# Patient Record
Sex: Female | Born: 1980 | Hispanic: Yes | Marital: Married | Smoking: Never smoker
Health system: Southern US, Community
[De-identification: ages and names within clinical notes are randomized; demographics above are authoritative.]

## PROBLEM LIST (undated history)

## (undated) DIAGNOSIS — R519 Headache, unspecified: Secondary | ICD-10-CM

## (undated) HISTORY — PX: LIPOSUCTION: SHX10

## (undated) HISTORY — DX: Headache, unspecified: R51.9

---

## 2006-12-02 HISTORY — PX: TUBAL LIGATION: SHX77

## 2019-08-22 ENCOUNTER — Other Ambulatory Visit: Payer: Self-pay

## 2019-08-22 ENCOUNTER — Emergency Department
Admission: EM | Admit: 2019-08-22 | Discharge: 2019-08-22 | Disposition: A | Discharge status: Home / Self Care | Payer: Self-pay | Attending: Emergency Medicine | Admitting: Emergency Medicine

## 2019-08-22 ENCOUNTER — Encounter: Payer: Self-pay | Admitting: *Deleted

## 2019-08-22 DIAGNOSIS — B029 Zoster without complications: Secondary | ICD-10-CM | POA: Insufficient documentation

## 2019-08-22 MED ORDER — HYDROCODONE-ACETAMINOPHEN 5-325 MG PO TABS
1.0000 | ORAL_TABLET | Freq: Once | ORAL | Status: AC
  Administered 2019-08-22: 1 via ORAL
  Filled 2019-08-22: qty 1

## 2019-08-22 MED ORDER — ONDANSETRON 4 MG PO TBDP
4.0000 mg | ORAL_TABLET | Freq: Once | ORAL | Status: AC
  Administered 2019-08-22: 4 mg via ORAL
  Filled 2019-08-22: qty 1

## 2019-08-22 MED ORDER — VALACYCLOVIR HCL 1 G PO TABS: 1000.0000 mg | ORAL_TABLET | Freq: Three times a day (TID) | ORAL | 0 refills | Status: AC

## 2019-08-22 MED ORDER — HYDROCODONE-ACETAMINOPHEN 5-325 MG PO TABS: 1.0000 | ORAL_TABLET | Freq: Four times a day (QID) | ORAL | 0 refills | Status: AC | PRN

## 2019-08-22 NOTE — ED Provider Notes (Signed)
Emergency Department Provider Note  ____________________________________________  Time seen: Approximately 10:26 PM  I have reviewed the triage vital signs and the nursing notes.   HISTORY  Chief Complaint Sunburn   Historian Mother    HPI Jill Gardner is a 39 y.o. female presents to the emergency department with an erythematous, papular, vesicular rash along anterior chest wall and a 4 cm x 3 cm region follows a dermatomal distribution.  Patient recently took a trip to [place] and is concerned that she has sunburn.  She states that she applied SPF 50 and reapplied it twice.  She has no other sunburn on her upper extremities or shoulders.  She states that she typically does not burn and never develops blisters.  No other alleviating measures have been attempted.   No past medical history on file.   Immunizations up to date:  Yes.     No past medical history on file.  There are no problems to display for this patient.    Prior to Admission medications   Medication Sig Start Date End Date Taking? Authorizing Provider  HYDROcodone-acetaminophen (NORCO) 5-325 MG tablet Take 1 tablet by mouth every 6 (six) hours as needed for up to 3 days for moderate pain. 08/22/19 08/25/19  Orvil Feil, PA-C  valACYclovir (VALTREX) 1000 MG tablet Take 1 tablet (1,000 mg total) by mouth 3 (three) times daily for 7 days. 08/22/19 08/29/19  Orvil Feil, PA-C    Allergies Morphine and related and Percocet [oxycodone-acetaminophen]  No family history on file.  Social History Social History   Tobacco Use  . Smoking status: Never Smoker  . Smokeless tobacco: Never Used  Substance Use Topics  . Alcohol use: Not Currently  . Drug use: Not Currently     Review of Systems  Constitutional: No fever/chills Eyes:  No discharge ENT: No upper respiratory complaints. Respiratory: no cough. No SOB/ use of accessory muscles to breath Gastrointestinal:   No nausea, no vomiting.   No diarrhea.  No constipation. Musculoskeletal: Negative for musculoskeletal pain. Skin: Patient has rash.     ____________________________________________   PHYSICAL EXAM:  VITAL SIGNS: ED Triage Vitals  Enc Vitals Group     BP 08/22/19 2107 112/66     Pulse Rate 08/22/19 2107 95     Resp 08/22/19 2107 20     Temp 08/22/19 2107 98.7 F (37.1 C)     Temp Source 08/22/19 2107 Oral     SpO2 08/22/19 2107 99 %     Weight 08/22/19 2109 158 lb (71.7 kg)     Height 08/22/19 2109 5\' 3"  (1.6 m)     Head Circumference --      Peak Flow --      Pain Score 08/22/19 2108 10     Pain Loc --      Pain Edu? --      Excl. in GC? --      Constitutional: Alert and oriented. Well appearing and in no acute distress. Eyes: Conjunctivae are normal. PERRL. EOMI. Head: Atraumatic. Cardiovascular: Normal rate, regular rhythm. Normal S1 and S2.  Good peripheral circulation. Respiratory: Normal respiratory effort without tachypnea or retractions. Lungs CTAB. Good air entry to the bases with no decreased or absent breath sounds Gastrointestinal: Bowel sounds x 4 quadrants. Soft and nontender to palpation. No guarding or rigidity. No distention. Musculoskeletal: Full range of motion to all extremities. No obvious deformities noted Neurologic:  Normal for age. No gross focal neurologic deficits are appreciated.  Skin: Patient has an erythematous vesicular rash along chest.  Vesicles are clustered and symmetrical. Psychiatric: Mood and affect are normal for age. Speech and behavior are normal.   ____________________________________________   LABS (all labs ordered are listed, but only abnormal results are displayed)  Labs Reviewed - No data to display ____________________________________________  EKG   ____________________________________________  RADIOLOGY   No results found.  ____________________________________________    PROCEDURES  Procedure(s) performed:      Procedures     Medications  HYDROcodone-acetaminophen (NORCO/VICODIN) 5-325 MG per tablet 1 tablet (has no administration in time range)  ondansetron (ZOFRAN-ODT) disintegrating tablet 4 mg (has no administration in time range)     ____________________________________________   INITIAL IMPRESSION / ASSESSMENT AND PLAN / ED COURSE  Pertinent labs & imaging results that were available during my care of the patient were reviewed by me and considered in my medical decision making (see chart for details).      Assessment and plan Zoster 39 year old female presents to the emergency department with a vesicular, erythematous rash.  Patient states that she does not typically sunburn and did not sunburn in any other areas of her body other than anterior chest.  Rash appears like a zoster with symmetrical vesicle formation.  Patient describes a prodrome of burning prior to appearance of rash and intense pain.  She was discharged with Valtrex and Vicodin for pain.  Return precautions were given to return with new or worsening symptoms.  All patient questions were answered.   ____________________________________________  FINAL CLINICAL IMPRESSION(S) / ED DIAGNOSES  Final diagnoses:  Herpes zoster without complication      NEW MEDICATIONS STARTED DURING THIS VISIT:  ED Discharge Orders         Ordered    valACYclovir (VALTREX) 1000 MG tablet  3 times daily     08/22/19 2222    HYDROcodone-acetaminophen (NORCO) 5-325 MG tablet  Every 6 hours PRN     08/22/19 2223              This chart was dictated using voice recognition software/Dragon. Despite best efforts to proofread, errors can occur which can change the meaning. Any change was purely unintentional.     Karren Cobble 08/22/19 2231    Carrie Mew, MD 08/23/19 [phone]

## 2019-08-22 NOTE — ED Notes (Addendum)
See triage note- Pt has red raised rash present on central chest. Pt reports it is very painful, sometimes even to talk.

## 2019-08-22 NOTE — ED Triage Notes (Signed)
Pt has sunburn to chest for 4 days.  Pt has a rash and pain with chills.  Pt alert.

## 2019-08-22 NOTE — Discharge Instructions (Signed)
It could take 7 to 10 days for blisters from shingles to resolve. Please take Valtrex 3 times daily for the next 7 days. You can also take Norco for pain.

## 2020-05-03 HISTORY — PX: ABDOMINAL SURGERY: SHX537

## 2020-05-19 DIAGNOSIS — Z20822 Contact with and (suspected) exposure to covid-19: Secondary | ICD-10-CM | POA: Diagnosis not present

## 2020-05-26 ENCOUNTER — Emergency Department
Admission: EM | Admit: 2020-05-26 | Discharge: 2020-05-26 | Disposition: A | Discharge status: Home / Self Care | Payer: BC Managed Care – PPO | Attending: Emergency Medicine | Admitting: Emergency Medicine

## 2020-05-26 ENCOUNTER — Encounter: Payer: Self-pay | Admitting: Emergency Medicine

## 2020-05-26 ENCOUNTER — Emergency Department: Payer: BC Managed Care – PPO

## 2020-05-26 ENCOUNTER — Other Ambulatory Visit: Payer: Self-pay

## 2020-05-26 DIAGNOSIS — R519 Headache, unspecified: Secondary | ICD-10-CM

## 2020-05-26 DIAGNOSIS — J329 Chronic sinusitis, unspecified: Secondary | ICD-10-CM | POA: Diagnosis not present

## 2020-05-26 LAB — POC URINE PREG, ED: Preg Test, Ur: NEGATIVE

## 2020-05-26 MED ORDER — MORPHINE SULFATE (PF) 2 MG/ML IV SOLN
2.0000 mg | Freq: Once | INTRAVENOUS | Status: AC
  Administered 2020-05-26: 2 mg via INTRAMUSCULAR
  Filled 2020-05-26: qty 1

## 2020-05-26 MED ORDER — ONDANSETRON 4 MG PO TBDP
4.0000 mg | ORAL_TABLET | Freq: Once | ORAL | Status: AC
  Administered 2020-05-26: 4 mg via ORAL
  Filled 2020-05-26: qty 1

## 2020-05-26 MED ORDER — METOCLOPRAMIDE HCL 5 MG/ML IJ SOLN
10.0000 mg | Freq: Once | INTRAMUSCULAR | Status: AC
  Administered 2020-05-26: 10 mg via INTRAVENOUS
  Filled 2020-05-26: qty 2

## 2020-05-26 MED ORDER — ONDANSETRON 4 MG PO TBDP: 4.0000 mg | ORAL_TABLET | Freq: Three times a day (TID) | ORAL | 0 refills | Status: DC | PRN

## 2020-05-26 MED ORDER — AMOXICILLIN-POT CLAVULANATE 875-125 MG PO TABS: 1.0000 | ORAL_TABLET | Freq: Two times a day (BID) | ORAL | 0 refills | Status: AC

## 2020-05-26 MED ORDER — PREDNISONE 10 MG PO TABS: ORAL_TABLET | ORAL | 0 refills | Status: DC

## 2020-05-26 MED ORDER — BUTALBITAL-APAP-CAFFEINE 50-325-40 MG PO TABS: 1.0000 | ORAL_TABLET | Freq: Four times a day (QID) | ORAL | 0 refills | Status: AC | PRN

## 2020-05-26 MED ORDER — DEXAMETHASONE SODIUM PHOSPHATE 10 MG/ML IJ SOLN
10.0000 mg | Freq: Once | INTRAMUSCULAR | Status: AC
  Administered 2020-05-26: 10 mg via INTRAMUSCULAR
  Filled 2020-05-26: qty 1

## 2020-05-26 MED ORDER — DIPHENHYDRAMINE HCL 50 MG/ML IJ SOLN
25.0000 mg | Freq: Once | INTRAMUSCULAR | Status: AC
  Administered 2020-05-26: 25 mg via INTRAVENOUS
  Filled 2020-05-26: qty 1

## 2020-05-26 MED ORDER — SODIUM CHLORIDE 0.9 % IV BOLUS
1000.0000 mL | Freq: Once | INTRAVENOUS | Status: AC
  Administered 2020-05-26: 1000 mL via INTRAVENOUS

## 2020-05-26 MED ORDER — KETOROLAC TROMETHAMINE 30 MG/ML IJ SOLN
30.0000 mg | Freq: Once | INTRAMUSCULAR | Status: AC
  Administered 2020-05-26: 30 mg via INTRAVENOUS
  Filled 2020-05-26: qty 1

## 2020-05-26 NOTE — ED Provider Notes (Signed)
[facility] Emergency Department Provider Note   ____________________________________________   Event Date/Time   First MD Initiated Contact with Patient 05/26/20 1123     (approximate)  I have reviewed the triage vital signs and the nursing notes.   HISTORY  Chief Complaint Headache (X 1 week)   HPI Jill Gardner is a 40 y.o. female resents to the ED with complaint of left-sided headache for 1 week.  Patient denies any previous headaches similar to this and denies any injury.  She states that there is no history of migraines.  Patient reports nausea but no vomiting, visual changes to her left eyes is slightly blurry and a "pins-and-needles" sensation in her left hand.  Patient also reports that when she blows her nose there is blood in the mucus.  Patient has been taking over-the-counter medication without any relief of her headache.  She denies any photophobia or worsening of her headache with sound.  She denies any Covid related symptoms or exposure.  She rates her pain as a 10/10.        History reviewed. No pertinent past medical history.  There are no problems to display for this patient.   History reviewed. No pertinent surgical history.  Prior to Admission medications   Medication Sig Start Date End Date Taking? Authorizing Provider  amoxicillin-clavulanate (AUGMENTIN) 875-125 MG tablet Take 1 tablet by mouth 2 (two) times daily for 7 days. 05/26/20 06/02/20 Yes Tommi Rumps, PA-C  butalbital-acetaminophen-caffeine (FIORICET) [phone] MG tablet Take 1 tablet by mouth every 6 (six) hours as needed for headache. 05/26/20 05/26/21 Yes Levada Schilling, Jasneet Schobert L, PA-C  ondansetron (ZOFRAN ODT) 4 MG disintegrating tablet Take 1 tablet (4 mg total) by mouth every 8 (eight) hours as needed for nausea or vomiting. 05/26/20  Yes Tommi Rumps, PA-C  predniSONE (DELTASONE) 10 MG tablet Take 3 once a day until finished 05/26/20  Yes Tommi Rumps, PA-C     Allergies Morphine and related and Percocet [oxycodone-acetaminophen]  History reviewed. No pertinent family history.  Social History Social History   Tobacco Use   Smoking status: Never Smoker   Smokeless tobacco: Never Used  Substance Use Topics   Alcohol use: Not Currently   Drug use: Not Currently    Review of Systems Constitutional: No fever/chills Eyes: Vision left eye blurry. ENT: No sore throat.  Positive left-sided facial pain. Cardiovascular: Denies chest pain. Respiratory: Denies shortness of breath. Gastrointestinal: No abdominal pain.  No nausea, no vomiting.  No diarrhea. Genitourinary: Negative for dysuria. Musculoskeletal: Negative for muscle skeletal pain. Skin: Negative for rash. Neurological: Positive left-sided headache.  Negative for focal weakness or numbness.   ____________________________________________   PHYSICAL EXAM:  VITAL SIGNS: ED Triage Vitals  Enc Vitals Group     BP 05/26/20 1031 112/73     Pulse Rate 05/26/20 1031 77     Resp 05/26/20 1031 16     Temp 05/26/20 1031 98.3 F (36.8 C)     Temp Source 05/26/20 1031 Oral     SpO2 05/26/20 1031 99 %     Weight 05/26/20 1033 168 lb (76.2 kg)     Height 05/26/20 1033 5\' 3"  (1.6 m)     Head Circumference --      Peak Flow --      Pain Score 05/26/20 1033 10     Pain Loc --      Pain Edu? --      Excl. in GC? --  Constitutional: Alert and oriented. Well appearing and in no acute distress.  No photophobia noted on exam. Eyes: Conjunctivae are normal. PERRL. EOMI. Head: Atraumatic. Nose: No congestion/rhinnorhea.  Mild tenderness is noted on percussion of the left frontal and maxillary sinus. Mouth/Throat: Mucous membranes are moist.  Oropharynx non-erythematous. Neck: No stridor.  No point tenderness on palpation cervical spine posteriorly.  Range of motion without restriction. Hematological/Lymphatic/Immunilogical: No cervical lymphadenopathy. Cardiovascular: Normal  rate, regular rhythm. Grossly normal heart sounds.  Good peripheral circulation. Respiratory: Normal respiratory effort.  No retractions. Lungs CTAB. Gastrointestinal: Soft and nontender. No distention.  Musculoskeletal: Moves upper and lower extremities with any difficulty.  Normal gait was noted.  Muscle strength bilaterally is equal upper and lower extremities. Neurologic:  Normal speech and language.  Cranial nerves II through XII grossly intact.  No gross focal neurologic deficits are appreciated. No gait instability. Skin:  Skin is warm, dry and intact. No rash noted. Psychiatric: Mood and affect are normal. Speech and behavior are normal.  ____________________________________________   LABS (all labs ordered are listed, but only abnormal results are displayed)  Labs Reviewed  POC URINE PREG, ED   ____________________________________________  ____________________________________________  RADIOLOGY Beaulah Corin, personally viewed and evaluated these images (plain radiographs) as part of my medical decision making, as well as reviewing the written report by the radiologist.   Official radiology report(s): CT Head Wo Contrast  Result Date: 05/26/2020 CLINICAL DATA:  Headaches EXAM: CT HEAD WITHOUT CONTRAST TECHNIQUE: Contiguous axial images were obtained from the base of the skull through the vertex without intravenous contrast. COMPARISON:  None. FINDINGS: Brain: No evidence of acute infarction, hemorrhage, hydrocephalus, extra-axial collection or mass lesion/mass effect. Vascular: No hyperdense vessel or unexpected calcification. Skull: Normal. Negative for fracture or focal lesion. Sinuses/Orbits: There is moderate mucosal thickening involving the sphenoid sinus. Mastoid air cells are clear. Other: None IMPRESSION: 1. No acute intracranial abnormalities. 2. Sphenoid sinus inflammation. Electronically Signed   By: Signa Kell M.D.   On: 05/26/2020 12:39     ____________________________________________   PROCEDURES  Procedure(s) performed (including Critical Care):  Procedures   ____________________________________________   INITIAL IMPRESSION / ASSESSMENT AND PLAN / ED COURSE  As part of my medical decision making, I reviewed the following data within the electronic MEDICAL RECORD NUMBER Notes from prior ED visits and [place] Controlled Substance Database  40 year old female presents to the ED with complaint of left-sided headache for 1 week.  Patient denies any previous history of migraines.  She reports that she has had some nausea with pressure to the left side of her head, blurred vision, and noticing blood in the mucus when she is blowing her nose.  Patient has been taking over-the-counter medication without any relief.  She also reports some "pins-and-needles" sensation in her left hand.  Cranial nerves II through XII grossly intact and patient's grip strength bilaterally is equal.  CT scan was negative for any acute changes other than some sinus inflammation.  Patient was made aware.  With percussion to the sinuses being tender patient is being treated for sinusitis along with her headache.  Prescription for Augmentin 875, prednisone 30 mg for 5 days, Fioricet every 6 hours and Zofran if needed was sent to her pharmacy.  Patient initially states that she felt better with the Decadron, morphine 2 mg and Zofran that she was given in the ED.  When RN went to discharge patient she states that her headache was coming back.  She was given  IV fluids 1 L, Benadryl 25 mg, Toradol 30 mg IV and Reglan 10 mg IV.  After 1 hour patient states that she is improved greatly.  She is to follow-up with her PCP if any continued problems and return to the emergency department if any urgent concerns or worsening of her symptoms.  ____________________________________________   FINAL CLINICAL IMPRESSION(S) / ED DIAGNOSES  Final diagnoses:  Acute nonintractable  headache, unspecified headache type  Sinus headache     ED Discharge Orders         Ordered    ondansetron (ZOFRAN ODT) 4 MG disintegrating tablet  Every 8 hours PRN        05/26/20 1401    butalbital-acetaminophen-caffeine (FIORICET) 50-325-40 MG tablet  Every 6 hours PRN        05/26/20 1401    predniSONE (DELTASONE) 10 MG tablet        05/26/20 1401    amoxicillin-clavulanate (AUGMENTIN) 875-125 MG tablet  2 times daily        05/26/20 1401          *Please note:  Devri Kreher was evaluated in Emergency Department on 05/26/2020 for the symptoms described in the history of present illness. She was evaluated in the context of the global COVID-19 pandemic, which necessitated consideration that the patient might be at risk for infection with the SARS-CoV-2 virus that causes COVID-19. Institutional protocols and algorithms that pertain to the evaluation of patients at risk for COVID-19 are in a state of rapid change based on information released by regulatory bodies including the CDC and federal and state organizations. These policies and algorithms were followed during the patient's care in the ED.  Some ED evaluations and interventions may be delayed as a result of limited staffing during and the pandemic.*   Note:  This document was prepared using Dragon voice recognition software and may include unintentional dictation errors.    Tommi Rumps, PA-C 05/26/20 1549    Willy Eddy, MD 05/26/20 [phone]

## 2020-05-26 NOTE — ED Triage Notes (Signed)
Pt to ED via POV c/o headache x 1 week. Pt states that the pain has been constant. Pt denies hx/o migraines. Pt states that yesterday she noticed that the vision in her left eye is slightly blurry. Pt also states that yesterday she noticed a sensation of "pins and needles" in her left hand. Pt also states that when she blows her nose there is some blood in the mucus. Pt is in NAD. Speech is clear. Pt states that she has taken OTC mediation without relief.

## 2020-05-26 NOTE — ED Notes (Signed)
Pt discussed with Dr. Roxan Hockey, no protocols at this time. Pt has had tubal ligation.

## 2020-05-26 NOTE — Discharge Instructions (Addendum)
Follow-up with your primary care provider if any continued problems or concerns.  You may also follow-up with [facility] acute care if you need to be seen.  Prescriptions were sent to your pharmacy.  Take these medications only as directed.  Also the medication for headaches should not be taken while you are driving or operating machinery as it could cause drowsiness and increase your risk for injury.

## 2020-06-06 ENCOUNTER — Other Ambulatory Visit: Payer: Self-pay

## 2020-06-06 ENCOUNTER — Ambulatory Visit (INDEPENDENT_AMBULATORY_CARE_PROVIDER_SITE_OTHER): Payer: BC Managed Care – PPO | Admitting: Obstetrics and Gynecology

## 2020-06-06 ENCOUNTER — Other Ambulatory Visit (HOSPITAL_COMMUNITY)
Admission: RE | Admit: 2020-06-06 | Discharge: 2020-06-06 | Disposition: A | Discharge status: Home / Self Care | Payer: BC Managed Care – PPO | Source: Ambulatory Visit | Attending: Obstetrics and Gynecology | Admitting: Obstetrics and Gynecology

## 2020-06-06 ENCOUNTER — Encounter: Payer: Self-pay | Admitting: Obstetrics and Gynecology

## 2020-06-06 VITALS — BP 100/68 | HR 84 | Resp 16 | Wt 181.3 lb

## 2020-06-06 DIAGNOSIS — N898 Other specified noninflammatory disorders of vagina: Secondary | ICD-10-CM

## 2020-06-06 DIAGNOSIS — N920 Excessive and frequent menstruation with regular cycle: Secondary | ICD-10-CM

## 2020-06-06 DIAGNOSIS — Z01419 Encounter for gynecological examination (general) (routine) without abnormal findings: Secondary | ICD-10-CM | POA: Insufficient documentation

## 2020-06-06 LAB — POCT URINALYSIS DIPSTICK
Appearance: NORMAL
Bilirubin, UA: NEGATIVE
Blood, UA: NEGATIVE
Glucose, UA: NEGATIVE
Ketones, UA: NEGATIVE
Leukocytes, UA: NEGATIVE
Nitrite, UA: NEGATIVE
Odor: NORMAL
Protein, UA: NEGATIVE
Spec Grav, UA: 1.025 (ref 1.010–1.025)
Urobilinogen, UA: 0.2 E.U./dL
pH, UA: 6 (ref 5.0–8.0)

## 2020-06-06 NOTE — Progress Notes (Signed)
HPI:      [name] is a 40 y.o. No obstetric history on file. who LMP was Patient's last menstrual period was 05/16/2020 (exact date).  Subjective:   She presents today for her annual examination.  Patient states that she is overdue for her annual examination and Pap smear. She has a history of 4 pregnancies and 5 children with a set of mono mono twins who are now 38 years old. Patient complains of heavy menstrual bleeding each month lasting longer and having more cramps than usual.  This has worsened over the last year. She has a tubal ligation for birth control. Of significant note patient states that she has been told in the past of uterine fibroids. She also complains today of a vaginal odor.  Not sure if it is her urine. She denies new sexual partners.  Not concerned regarding STDs    Hx: The following portions of the patient's history were reviewed and updated as appropriate:             She  has a past medical history of Headache. She does not have a problem list on file. She  has a past surgical history that includes Tubal ligation (12/2006). Her family history includes Colitis in her mother; Lupus in her maternal grandmother and mother. She  reports that she has never smoked. She has never used smokeless tobacco. She reports current alcohol use. She reports previous drug use. She has a current medication list which includes the following prescription(s): amoxicillin, butalbital-acetaminophen-caffeine, ferrous sulfate, folic acid, ondansetron, phentermine, and prednisone. She is allergic to morphine and related and percocet [oxycodone-acetaminophen].       Review of Systems:  Review of Systems  Constitutional: Denied constitutional symptoms, night sweats, recent illness, fatigue, fever, insomnia and weight loss.  Eyes: Denied eye symptoms, eye pain, photophobia, vision change and visual disturbance.  Ears/Nose/Throat/Neck: Denied ear, nose, throat or neck symptoms,  hearing loss, nasal discharge, sinus congestion and sore throat.  Cardiovascular: Denied cardiovascular symptoms, arrhythmia, chest pain/pressure, edema, exercise intolerance, orthopnea and palpitations.  Respiratory: Denied pulmonary symptoms, asthma, pleuritic pain, productive sputum, cough, dyspnea and wheezing.  Gastrointestinal: Denied, gastro-esophageal reflux, melena, nausea and vomiting.  Genitourinary: See HPI for additional information.  Musculoskeletal: Denied musculoskeletal symptoms, stiffness, swelling, muscle weakness and myalgia.  Dermatologic: Denied dermatology symptoms, rash and scar.  Neurologic: Denied neurology symptoms, dizziness, headache, neck pain and syncope.  Psychiatric: Denied psychiatric symptoms, anxiety and depression.  Endocrine: Denied endocrine symptoms including hot flashes and night sweats.   Meds:   Current Outpatient Medications on File Prior to Visit  Medication Sig Dispense Refill  . amoxicillin (AMOXIL) 500 MG capsule Take 500 mg by mouth 3 (three) times daily.    . butalbital-acetaminophen-caffeine (FIORICET) 50-325-40 MG tablet Take 1 tablet by mouth every 6 (six) hours as needed for headache. 15 tablet 0  . ferrous sulfate 325 (65 FE) MG tablet Take by mouth.    . folic acid (FOLVITE) 1 MG tablet Take by mouth.    . ondansetron (ZOFRAN ODT) 4 MG disintegrating tablet Take 1 tablet (4 mg total) by mouth every 8 (eight) hours as needed for nausea or vomiting. 12 tablet 0  . phentermine (ADIPEX-P) 37.5 MG tablet phentermine 37.5 mg tablet  Take 1 tablet every day by oral route in the morning.    . predniSONE (DELTASONE) 10 MG tablet Take 3 once a day until finished 15 tablet 0   No current facility-administered medications on file prior  to visit.       The pregnancy intention screening data noted above was reviewed. Potential methods of contraception were discussed. The patient elected to proceed with Female Sterilization.     Objective:      Vitals:   06/06/20 1120  BP: 100/68  Pulse: 84  Resp: 16    Filed Weights   06/06/20 1120  Weight: 181 lb 4.8 oz (82.2 kg)              Physical examination General NAD, Conversant  HEENT Atraumatic; Op clear with mmm.  Normo-cephalic. Pupils reactive. Anicteric sclerae  Thyroid/Neck Smooth without nodularity or enlargement. Normal ROM.  Neck Supple.  Skin No rashes, lesions or ulceration. Normal palpated skin turgor. No nodularity.  Breasts: No masses or discharge.  Symmetric.  No axillary adenopathy.  Lungs: Clear to auscultation.No rales or wheezes. Normal Respiratory effort, no retractions.  Heart: NSR.  No murmurs or rubs appreciated. No periferal edema  Abdomen: Soft.  Non-tender.  No masses.  No HSM. No hernia  Extremities: Moves all appropriately.  Normal ROM for age. No lymphadenopathy.  Neuro: Oriented to PPT.  Normal mood. Normal affect.     Pelvic:   Vulva: Normal appearance.  No lesions.  Vagina: No lesions or abnormalities noted.  Support: Normal pelvic support.  Urethra No masses tenderness or scarring.  Meatus Normal size without lesions or prolapse.  Cervix: Normal appearance.  No lesions.  Anus: Normal exam.  No lesions.  Perineum: Normal exam.  No lesions.        Bimanual   Uterus:  Enlarged-12 weeks-slightly irregular.  Non-tender.  Mobile.  AV.  Adnexae: No masses.  Non-tender to palpation.  Cul-de-sac: Negative for abnormality.   U/A -no evidence of UTI.   Assessment:    No obstetric history on file. There are no problems to display for this patient.    1. Well woman exam with routine gynecological exam   2. Vaginal odor   3. Menorrhagia with regular cycle     Patient likely has uterine fibroids causing menorrhagia and cramping.   Plan:            1.  Basic Screening Recommendations The basic screening recommendations for asymptomatic women were discussed with the patient during her visit.  The age-appropriate recommendations were  discussed with her and the rational for the tests reviewed.  When I am informed by the patient that another primary care physician has previously obtained the age-appropriate tests and they are up-to-date, only outstanding tests are ordered and referrals given as necessary.  Abnormal results of tests will be discussed with her when all of her results are completed.  Routine preventative health maintenance measures emphasized: Exercise/Diet/Weight control, Tobacco Warnings, Alcohol/Substance use risks and Stress Management Pap performed -lab work ordered 2.  Nuswab for BV and yeast 3.  Ultrasound to delineate size and location of fibroids.  We have discussed the possible diagnosis of fibroids and how this affects the menstrual cycle.  Natural course and history of fibroids was discussed.  Hospital treatment options discussed.  Orders Orders Placed This Encounter  Procedures  . US PELVIS (TRANSABDOMINAL ONLY)  . US PELVIS TRANSVAGINAL NON-OB (TV ONLY)  . CBC  . Cholesterol, total  . Glucose, random  . Hemoglobin A1c  . Lipid panel  . TSH  . POCT Urinalysis Dipstick    No orders of the defined types were placed in this encounter.         F/U  Return in about 3 weeks (around 06/27/2020). I spent 14 additional minutes involved in the care of this patient discussing uterine fibroids, diagnosis and possible treatment options.  Discussing control of heavy menstrual bleeding caused by fibroids.  Differential diagnosis of vaginal odor discussed.  All questions answered.     Elonda Husky, M.D. 06/06/2020 12:46 PM

## 2020-06-09 DIAGNOSIS — Z01419 Encounter for gynecological examination (general) (routine) without abnormal findings: Secondary | ICD-10-CM | POA: Diagnosis not present

## 2020-06-09 DIAGNOSIS — N898 Other specified noninflammatory disorders of vagina: Secondary | ICD-10-CM | POA: Diagnosis not present

## 2020-06-09 LAB — CERVICOVAGINAL ANCILLARY ONLY
Bacterial Vaginitis (gardnerella): NEGATIVE
Candida Glabrata: NEGATIVE
Candida Vaginitis: NEGATIVE
Chlamydia: NEGATIVE
Comment: NEGATIVE
Comment: NEGATIVE
Comment: NEGATIVE
Comment: NEGATIVE
Comment: NORMAL
Neisseria Gonorrhea: NEGATIVE

## 2020-06-10 ENCOUNTER — Telehealth: Payer: Self-pay

## 2020-06-10 NOTE — Telephone Encounter (Signed)
Patient informed all testing was within normal range per Dr Logan Bores.

## 2020-06-13 LAB — CYTOLOGY - PAP
Comment: NEGATIVE
Diagnosis: NEGATIVE
Diagnosis: REACTIVE
High risk HPV: NEGATIVE

## 2020-06-17 ENCOUNTER — Ambulatory Visit (INDEPENDENT_AMBULATORY_CARE_PROVIDER_SITE_OTHER): Payer: BC Managed Care – PPO

## 2020-06-17 ENCOUNTER — Other Ambulatory Visit: Payer: Self-pay

## 2020-06-17 ENCOUNTER — Other Ambulatory Visit: Payer: BC Managed Care – PPO

## 2020-06-17 DIAGNOSIS — Z01419 Encounter for gynecological examination (general) (routine) without abnormal findings: Secondary | ICD-10-CM | POA: Diagnosis not present

## 2020-06-17 DIAGNOSIS — N920 Excessive and frequent menstruation with regular cycle: Secondary | ICD-10-CM

## 2020-06-18 LAB — LIPID PANEL
Chol/HDL Ratio: 5.4 ratio — ABNORMAL HIGH (ref 0.0–4.4)
Cholesterol, Total: 256 mg/dL — ABNORMAL HIGH (ref 100–199)
HDL: 47 mg/dL (ref >39)
LDL Chol Calc (NIH): 173 mg/dL — ABNORMAL HIGH (ref 0–99)
Triglycerides: 194 mg/dL — ABNORMAL HIGH (ref 0–149)
VLDL Cholesterol Cal: 36 mg/dL (ref 5–40)

## 2020-06-18 LAB — CBC
Hematocrit: 35.4 % (ref 34.0–46.6)
Hemoglobin: 11.7 g/dL (ref 11.1–15.9)
MCH: 27.4 pg (ref 26.6–33.0)
MCHC: 33.1 g/dL (ref 31.5–35.7)
MCV: 83 fL (ref 79–97)
Platelets: 300 10*3/uL (ref 150–450)
RBC: 4.27 x10E6/uL (ref 3.77–5.28)
RDW: 13.8 % (ref 11.7–15.4)
WBC: 5.3 10*3/uL (ref 3.4–10.8)

## 2020-06-18 LAB — TSH: TSH: 2.61 u[IU]/mL (ref 0.450–4.500)

## 2020-06-18 LAB — GLUCOSE, RANDOM: Glucose: 95 mg/dL (ref 65–99)

## 2020-07-28 ENCOUNTER — Emergency Department (HOSPITAL_COMMUNITY)
Admission: EM | Admit: 2020-07-28 | Discharge: 2020-07-28 | Disposition: A | Discharge status: Home / Self Care | Payer: BC Managed Care – PPO | Attending: Emergency Medicine | Admitting: Emergency Medicine

## 2020-07-28 ENCOUNTER — Encounter (HOSPITAL_COMMUNITY): Payer: Self-pay | Admitting: Emergency Medicine

## 2020-07-28 ENCOUNTER — Other Ambulatory Visit: Payer: Self-pay

## 2020-07-28 DIAGNOSIS — M546 Pain in thoracic spine: Secondary | ICD-10-CM | POA: Diagnosis not present

## 2020-07-28 MED ORDER — CYCLOBENZAPRINE HCL 5 MG PO TABS
7.5000 mg | ORAL_TABLET | Freq: Once | ORAL | Status: AC
  Administered 2020-07-28: 7.5 mg via ORAL
  Filled 2020-07-28: qty 1.5

## 2020-07-28 MED ORDER — KETOROLAC TROMETHAMINE 30 MG/ML IJ SOLN
30.0000 mg | Freq: Once | INTRAMUSCULAR | Status: AC
  Administered 2020-07-28: 30 mg via INTRAMUSCULAR

## 2020-07-28 MED ORDER — CYCLOBENZAPRINE HCL 10 MG PO TABS: 10.0000 mg | ORAL_TABLET | Freq: Two times a day (BID) | ORAL | 0 refills | Status: DC | PRN

## 2020-07-28 MED ORDER — KETOROLAC TROMETHAMINE 30 MG/ML IJ SOLN
30.0000 mg | Freq: Once | INTRAMUSCULAR | Status: DC
  Filled 2020-07-28: qty 1

## 2020-07-28 NOTE — ED Triage Notes (Signed)
Per EMS- Patient was sitting in a chair at work and began having right lower back pain approx 1 hour ago. Patient rates pain 6/10. patient denies any injuries, trauma, or falls. patient denies any issues with urinating.

## 2020-07-28 NOTE — ED Provider Notes (Signed)
[facility]-EMERGENCY DEPT Provider Note   CSN: [id number] Arrival date & time: 07/28/20  1527     History Chief Complaint  Patient presents with  . Back Pain    Jill Gardner is a 40 y.o. female.  HPI   Patient with no significant medical history presents with chief complaint of right-sided upper back pain.  She endorses this started 1 hour ago.  She was sitting at her desk went to stand up and felt like her back locked up.  She has worsening pain when she ambulates specially when she moves her right leg when she moves her right arm. She denies paresthesias or weakness in the upper or lower extremities, denies urinary incontinency, retention, difficult bowel movements.  She has no autoimmune diseases, no history of IV drug use, no history of kidney stones or connective tissue disorders.  She denies  urinary symptoms, denies cough, congestion, chest pain or shortness of breath, has no history of PEs or DVTs, currently not on hormone therapy.  Patient has not taken any medication for this, never experienced this in the past denies relieving factors.  Patient has headaches, fevers, chills, shortness of breath, chest pain, abdominal pain, nausea, vomiting, diarrhea, worsening pedal edema.  Past Medical History:  Diagnosis Date  . Headache     There are no problems to display for this patient.   Past Surgical History:  Procedure Laterality Date  . TUBAL LIGATION  12/2006     OB History   No obstetric history on file.     Family History  Problem Relation Age of Onset  . Lupus Mother   . Colitis Mother   . Lupus Maternal Grandmother     Social History   Tobacco Use  . Smoking status: Never Smoker  . Smokeless tobacco: Never Used  Substance Use Topics  . Alcohol use: Yes  . Drug use: Not Currently    Home Medications Prior to Admission medications   Medication Sig Start Date End Date Taking? Authorizing Provider  cyclobenzaprine (FLEXERIL) 10  MG tablet Take 1 tablet (10 mg total) by mouth 2 (two) times daily as needed for muscle spasms. 07/28/20  Yes Carroll Sage, PA-C  amoxicillin (AMOXIL) 500 MG capsule Take 500 mg by mouth 3 (three) times daily. 04/08/20   [provider]  butalbital-acetaminophen-caffeine (FIORICET) 50-325-40 MG tablet Take 1 tablet by mouth every 6 (six) hours as needed for headache. 05/26/20 05/26/21  Bridget Hartshorn L, PA-C  ferrous sulfate 325 (65 FE) MG tablet Take by mouth.    [provider]  folic acid (FOLVITE) 1 MG tablet Take by mouth. 05/10/18   [provider]  ondansetron (ZOFRAN ODT) 4 MG disintegrating tablet Take 1 tablet (4 mg total) by mouth every 8 (eight) hours as needed for nausea or vomiting. 05/26/20   Tommi Rumps, PA-C  phentermine (ADIPEX-P) 37.5 MG tablet phentermine 37.5 mg tablet  Take 1 tablet every day by oral route in the morning.    [provider]  predniSONE (DELTASONE) 10 MG tablet Take 3 once a day until finished 05/26/20   Tommi Rumps, PA-C    Allergies    Morphine and related and Percocet [oxycodone-acetaminophen]  Review of Systems   Review of Systems  Constitutional: Negative for chills and fever.  HENT: Negative for congestion.   Respiratory: Negative for shortness of breath.   Cardiovascular: Negative for chest pain.  Gastrointestinal: Negative for abdominal pain, diarrhea, nausea and vomiting.  Genitourinary:  Negative for enuresis and flank pain.  Musculoskeletal: Positive for back pain.  Skin: Negative for rash.  Neurological: Negative for dizziness and headaches.  Hematological: Does not bruise/bleed easily.    Physical Exam Updated Vital Signs BP 118/87 (BP Location: Left Arm)   Pulse 72   Temp (!) 97.5 F (36.4 C) (Oral)   Resp 18   Ht 5\' 3"  (1.6 m)   Wt 79.4 kg   SpO2 98%   BMI 31.00 kg/m   Physical Exam Vitals and nursing note reviewed.  Constitutional:      General: She is not in acute  distress.    Appearance: She is not ill-appearing.  HENT:     Head: Normocephalic and atraumatic.     Nose: No congestion.  Eyes:     Conjunctiva/sclera: Conjunctivae normal.  Cardiovascular:     Rate and Rhythm: Normal rate and regular rhythm.     Pulses: Normal pulses.     Heart sounds: No murmur heard. No friction rub. No gallop.   Pulmonary:     Effort: No respiratory distress.     Breath sounds: No wheezing, rhonchi or rales.  Abdominal:     Palpations: Abdomen is soft.     Tenderness: There is no abdominal tenderness. There is no right CVA tenderness or left CVA tenderness.  Musculoskeletal:     Cervical back: No rigidity.     Comments: Spine was palpated it was nontender to palpation, no step-off or deformities present.  Patient noted tenderness along the right inferior aspect of the scapula, worsened with right arm movement.  Patient can move all 4 extremities without difficulties, 5 of 5 strength.   Skin:    General: Skin is warm and dry.  Neurological:     Mental Status: She is alert.  Psychiatric:        Mood and Affect: Mood normal.     ED Results / Procedures / Treatments   Labs (all labs ordered are listed, but only abnormal results are displayed) Labs Reviewed - No data to display  EKG None  Radiology No results found.  Procedures Procedures   Medications Ordered in ED Medications  ketorolac (TORADOL) 30 MG/ML injection 30 mg (has no administration in time range)  cyclobenzaprine (FLEXERIL) tablet 7.5 mg (has no administration in time range)    ED Course  I have reviewed the triage vital signs and the nursing notes.  Pertinent labs & imaging results that were available during my care of the patient were reviewed by me and considered in my medical decision making (see chart for details).    MDM Rules/Calculators/A&P                         Initial impression-acute right-sided back pain.  She is alert, does not appear acute distress, vital  signs reassuring.  Work-up-due to well-appearing patient, benign physical exam, further lab work and imaging not warranted at this time.  Rule out- I have low suspicion for spinal fracture or spinal cord abnormality as patient denies urinary incontinency, retention, difficulty with bowel movements, denies saddle paresthesias.  Spine was palpated there is no step-off, crepitus or gross deformities felt, patient had full range of motion in all 4 extremities.  Due to no recent traumas will defer imaging at this time .low suspicion for AAA as patient has low risk factors, exam consistent with etiology, no bulging mass on my exam.  Low suspicion for septic arthritis as patient denies  IV drug use, skin exam was performed no erythematous, edema or warm joints noted.  low suspicion for UTI or pyelonephritis as patient has no CVA tenderness, denies urinary symptoms.  Low suspicion for kidney stone as patient has no history of this, denies urinary symptoms, no CVA tenderness.  Low suspicion for lower lobe pneumonia as patient denies URI-like symptoms, lung sounds are clear bilaterally.   Plan-suspect patient is suffering from a muscular strain, will provide patient with muscle relaxers, recommend over-the-counter pain medications, stretching follow-up PCP for further evaluation.  Vital signs have remained stable, no indication for hospital admission.   Patient given at home care as well strict return precautions.  Patient verbalized that they understood agreed to said plan.   Final Clinical Impression(s) / ED Diagnoses Final diagnoses:  Acute right-sided thoracic back pain    Rx / DC Orders ED Discharge Orders         Ordered    cyclobenzaprine (FLEXERIL) 10 MG tablet  2 times daily PRN        07/28/20 1606           Barnie Del 07/28/20 1630    Lorre Nick, MD 07/29/20 [phone]

## 2020-07-28 NOTE — Discharge Instructions (Signed)
You have been seen here for back pain.  Given you prescription for muscle relaxer please take as prescribed, please beware this medication make you drowsy do not consume alcohol or operate heavy machinery while taking this medication.  I recommend taking over-the-counter pain medications like ibuprofen and/or Tylenol every 6 as needed.  Please follow dosage and on the back of bottle.  I also recommend applying heat to the area and stretching out the muscles as this will help decrease stiffness and pain.  I have given you information on exercises please follow.  I want you to follow-up with primary care provider in 1 week's time for reevaluation.  Given you the contact information above please call  Come back to the emergency department if you develop chest pain, shortness of breath, severe abdominal pain, uncontrolled nausea, vomiting, diarrhea.

## 2020-10-02 ENCOUNTER — Encounter: Payer: BC Managed Care – PPO | Admitting: Obstetrics and Gynecology

## 2020-10-07 ENCOUNTER — Encounter: Payer: BC Managed Care – PPO | Admitting: Obstetrics and Gynecology

## 2020-10-17 ENCOUNTER — Encounter: Payer: Self-pay | Admitting: Obstetrics and Gynecology

## 2020-10-22 ENCOUNTER — Encounter: Payer: Self-pay | Admitting: Obstetrics and Gynecology

## 2021-06-19 ENCOUNTER — Other Ambulatory Visit: Payer: Self-pay

## 2021-06-19 ENCOUNTER — Encounter: Payer: Self-pay | Admitting: Obstetrics and Gynecology

## 2021-06-19 ENCOUNTER — Ambulatory Visit (INDEPENDENT_AMBULATORY_CARE_PROVIDER_SITE_OTHER): Payer: Self-pay | Admitting: Obstetrics and Gynecology

## 2021-06-19 VITALS — BP 88/64 | HR 80 | Ht 63.0 in | Wt 149.5 lb

## 2021-06-19 DIAGNOSIS — R399 Unspecified symptoms and signs involving the genitourinary system: Secondary | ICD-10-CM

## 2021-06-19 LAB — POCT URINALYSIS DIPSTICK
Glucose, UA: NEGATIVE
Ketones, UA: NEGATIVE
Leukocytes, UA: NEGATIVE
Nitrite, UA: NEGATIVE
Protein, UA: NEGATIVE
Spec Grav, UA: 1.025 (ref 1.010–1.025)
Urobilinogen, UA: 0.2 E.U./dL
pH, UA: 6 (ref 5.0–8.0)

## 2021-06-19 NOTE — Progress Notes (Signed)
HPI:      [name] is a 41 y.o. [id number] who LMP was Patient's last menstrual period was 05/22/2021 (exact date).  Subjective:   She presents today stating that she has symptoms of a urinary tract infection with urinary frequency and her urine is dark in color.  She would like to have her urine checked. She denies any vaginal bleeding at this time. She states that for approximately 2 weeks she has had a small rash on her neck and on her left side that is itchy.  She denies changing any detergents soaps etc.  She knows of nothing that she could have contacted to cause this.    Hx: The following portions of the patient's history were reviewed and updated as appropriate:             She  has a past medical history of Headache. She does not have a problem list on file. She  has a past surgical history that includes Tubal ligation (12/2006) and Abdominal surgery (2022). Her family history includes Colitis in her mother; Lupus in her maternal grandmother and mother. She  reports that she has never smoked. She has never used smokeless tobacco. She reports current alcohol use. She reports that she does not currently use drugs. She has a current medication list which includes the following prescription(s): prednisone, amoxicillin, cyclobenzaprine, ferrous sulfate, folic acid, and phentermine. She is allergic to morphine and related and percocet [oxycodone-acetaminophen].       Review of Systems:  Review of Systems  Constitutional: Denied constitutional symptoms, night sweats, recent illness, fatigue, fever, insomnia and weight loss.  Eyes: Denied eye symptoms, eye pain, photophobia, vision change and visual disturbance.  Ears/Nose/Throat/Neck: Denied ear, nose, throat or neck symptoms, hearing loss, nasal discharge, sinus congestion and sore throat.  Cardiovascular: Denied cardiovascular symptoms, arrhythmia, chest pain/pressure, edema, exercise intolerance, orthopnea and palpitations.   Respiratory: Denied pulmonary symptoms, asthma, pleuritic pain, productive sputum, cough, dyspnea and wheezing.  Gastrointestinal: Denied, gastro-esophageal reflux, melena, nausea and vomiting.  Genitourinary: See HPI for additional information.  Musculoskeletal: Denied musculoskeletal symptoms, stiffness, swelling, muscle weakness and myalgia.  Dermatologic: See HPI for additional information.  Neurologic: Denied neurology symptoms, dizziness, headache, neck pain and syncope.  Psychiatric: Denied psychiatric symptoms, anxiety and depression.  Endocrine: Denied endocrine symptoms including hot flashes and night sweats.   Meds:   Current Outpatient Medications on File Prior to Visit  Medication Sig Dispense Refill   predniSONE (DELTASONE) 10 MG tablet Take 3 once a day until finished 15 tablet 0   amoxicillin (AMOXIL) 500 MG capsule Take 500 mg by mouth 3 (three) times daily. (Patient not taking: Reported on 06/19/2021)     cyclobenzaprine (FLEXERIL) 10 MG tablet Take 1 tablet (10 mg total) by mouth 2 (two) times daily as needed for muscle spasms. (Patient not taking: Reported on 06/19/2021) 20 tablet 0   ferrous sulfate 325 (65 FE) MG tablet Take by mouth. (Patient not taking: Reported on 0000000)     folic acid (FOLVITE) 1 MG tablet Take by mouth. (Patient not taking: Reported on 06/19/2021)     phentermine (ADIPEX-P) 37.5 MG tablet phentermine 37.5 mg tablet  Take 1 tablet every day by oral route in the morning. (Patient not taking: Reported on 06/19/2021)     No current facility-administered medications on file prior to visit.      Objective:     Vitals:   06/19/21 1101  BP: (!) 88/64  Pulse: 80   Filed  Weights   06/19/21 1101  Weight: 149 lb 8 oz (67.8 kg)              UA shows moderate blood but nothing else significant.  Patient with a mildly erythematous punctate rash on her neck dime sized and an area on her left flank approximately half dollar size.           Assessment:    [id number] There are no problems to display for this patient.    1. UTI symptoms     No obvious UTI based on dip but moderate blood.  Will send for culture.   Plan:            1.  Urine for culture  2.  1% hydrocortisone cream to rash.  3.  Normal ultrasound findings discussed.  4.  Elevated cholesterol discussed-patient will discuss this further with her primary care doctor. Orders Orders Placed This Encounter  Procedures   Urine Culture   POCT urinalysis dipstick    No orders of the defined types were placed in this encounter.     F/U  No follow-ups on file. I spent 23 minutes involved in the care of this patient preparing to see the patient by obtaining and reviewing her medical history (including labs, imaging tests and prior procedures), documenting clinical information in the electronic health record (EHR), counseling and coordinating care plans, writing and sending prescriptions, ordering tests or procedures and in direct communicating with the patient and medical staff discussing pertinent items from her history and physical exam.  Finis Bud, M.D. 06/19/2021 11:26 AM

## 2021-06-19 NOTE — Progress Notes (Deleted)
Patient presents for possible UTI. Patient states having discharge, substances in urine, dark colored urine and the feeling of not emptying her bladder fully. Patient states this has been going on for ____ Patient has tried____  Patient states no other questions or concerns.

## 2021-06-19 NOTE — Progress Notes (Signed)
Patient presents today for possible UTI. Patient states discharge, urine dark in color and the feeling of being unable to fully empty bladder going on for 2-3 weeks. Patient states a recent rash on neck and L side of abdominal.

## 2021-06-19 NOTE — Progress Notes (Signed)
c 

## 2021-06-22 LAB — URINE CULTURE

## 2022-02-18 IMAGING — CT CT HEAD W/O CM
3 series · 16 of 47 positions shown, 19 images · non-contrast
Comparison: None.

CLINICAL DATA: Headaches

EXAM:
CT HEAD WITHOUT CONTRAST
TECHNIQUE: Contiguous axial images were obtained from the base of the skull
through the vertex without intravenous contrast.

[Series 3: head wo · axial · 0.40mm/px · z∈[+1007,+1137]mm · 10 of 32 slices shown, 13 images]
[im 3/32  brain]
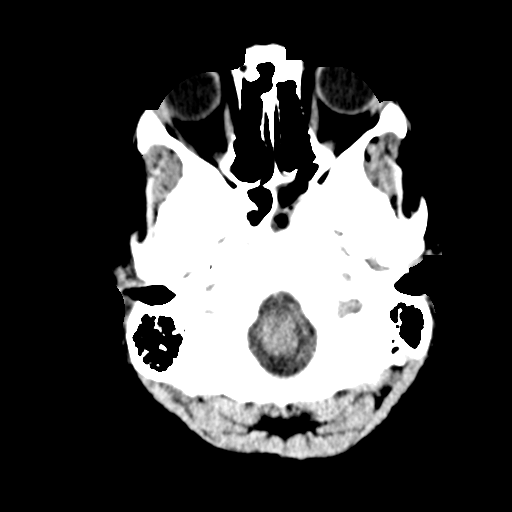
[im 3/32  bone]
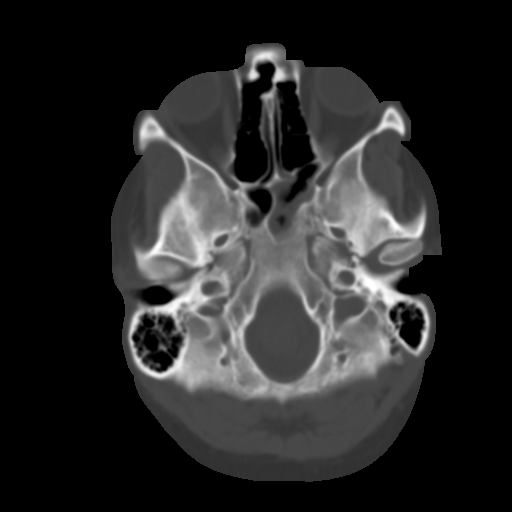
[im 6/32  brain]
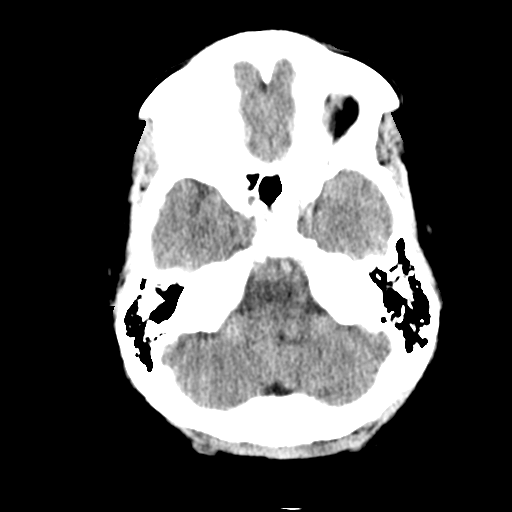
[im 9/32  brain]
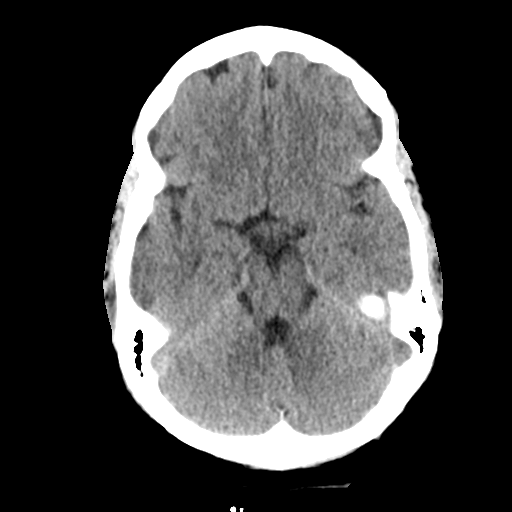
[im 11/32  brain]
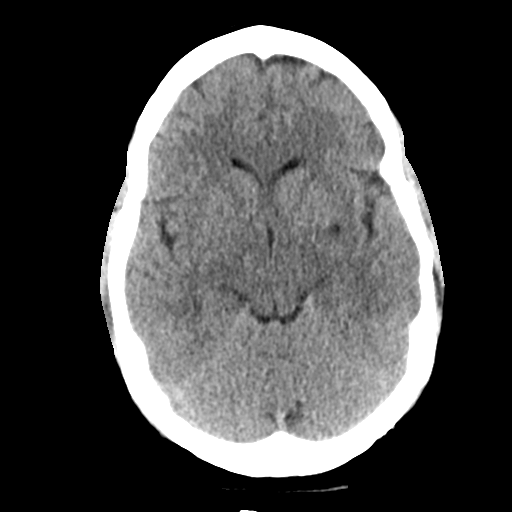
[im 14/32  brain]
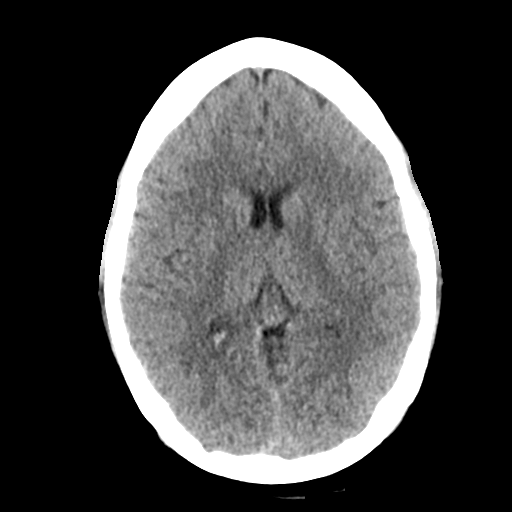
[im 14/32  bone]
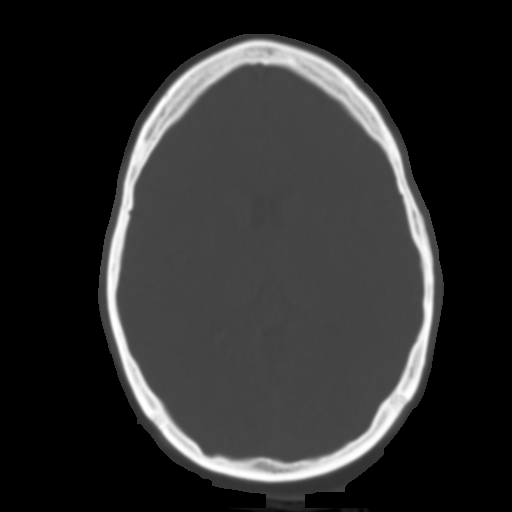
[im 18/32  brain]
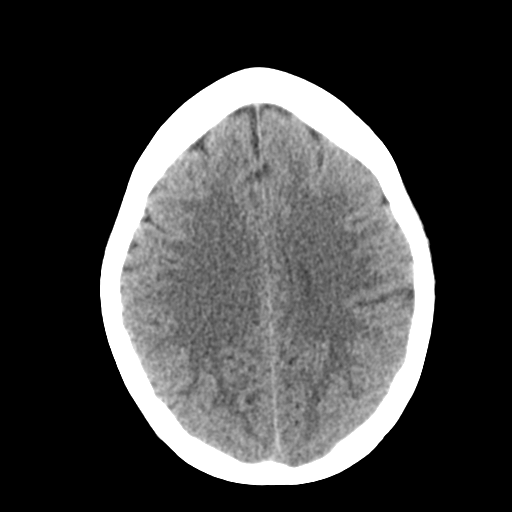
[im 21/32  brain]
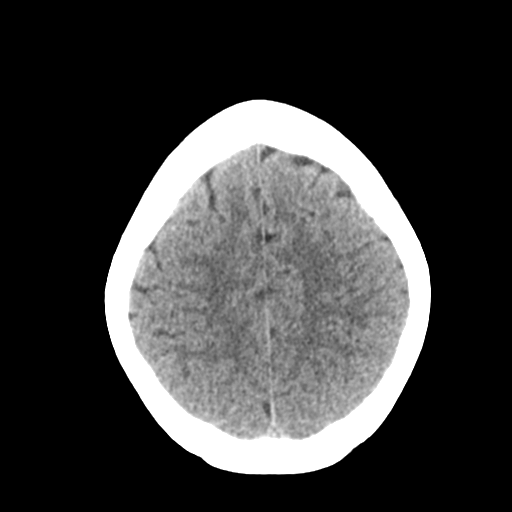
[im 24/32  brain]
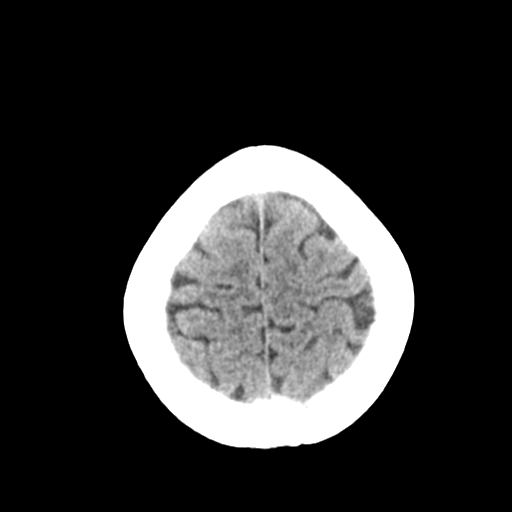
[im 26/32  brain]
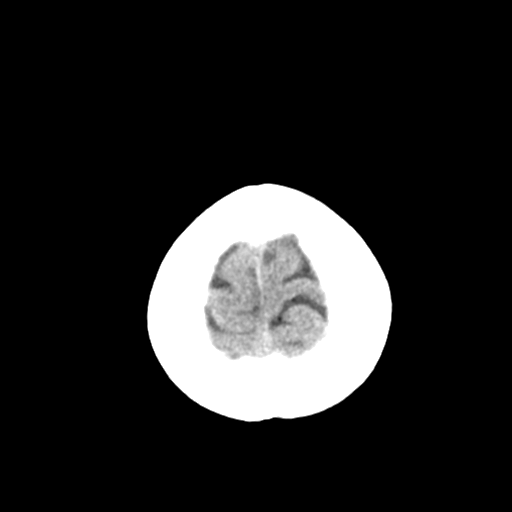
[im 26/32  bone]
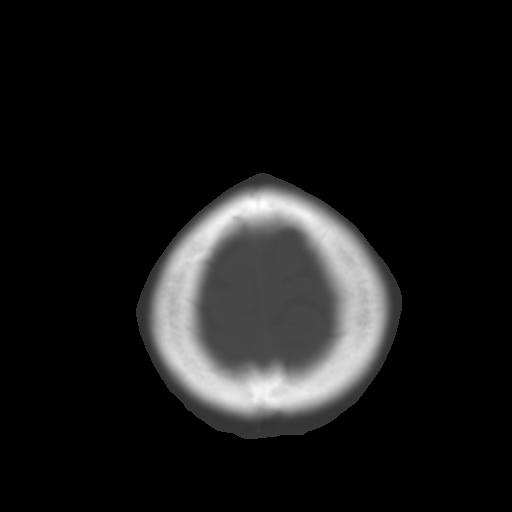
[im 29/32  brain]
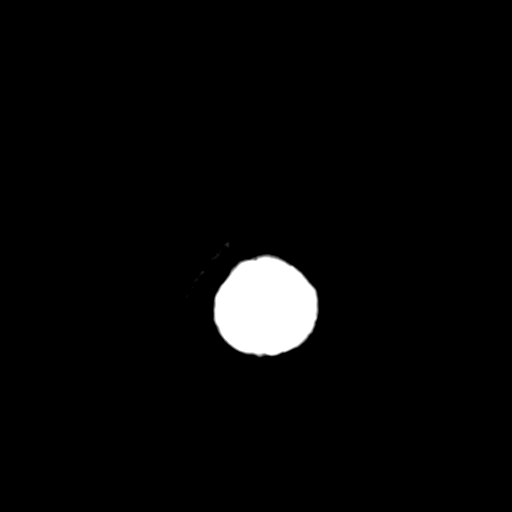

[Series 4: coronal soft tissue · coronal · 0.32mm/px · 3 of 66 slices shown]
[im 22/66  brain]
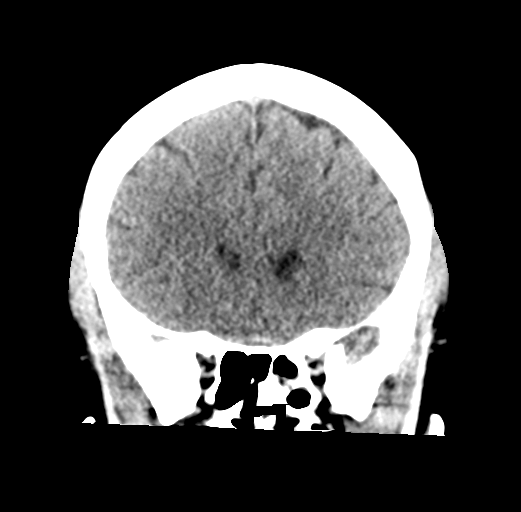
[im 29/66  brain]
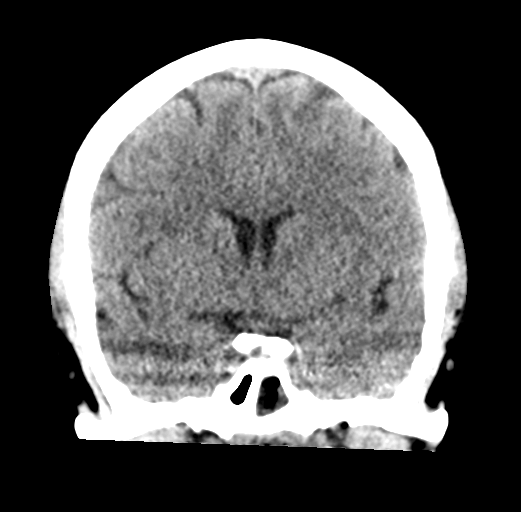
[im 37/66  brain]
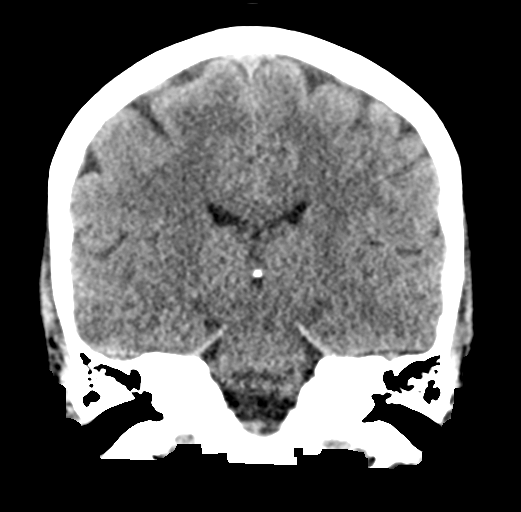

[Series 5: sagittal soft tissue · sagittal · 0.32mm/px · 3 of 57 slices shown]
[im 19/57  brain]
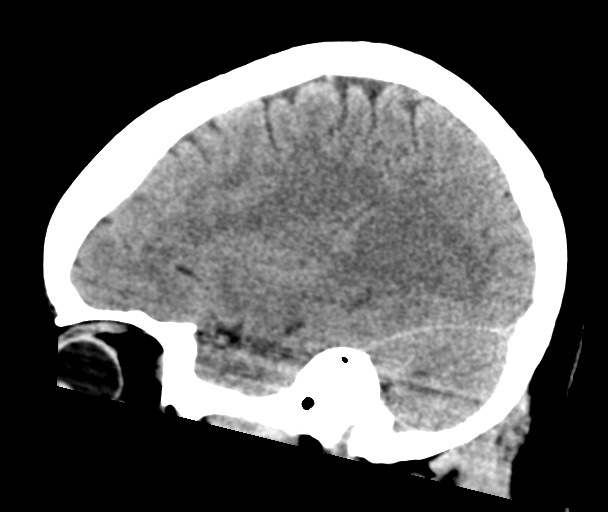
[im 29/57  brain]
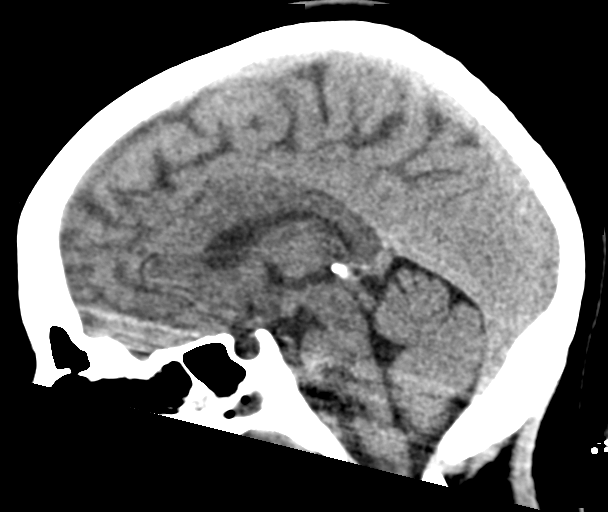
[im 38/57  brain]
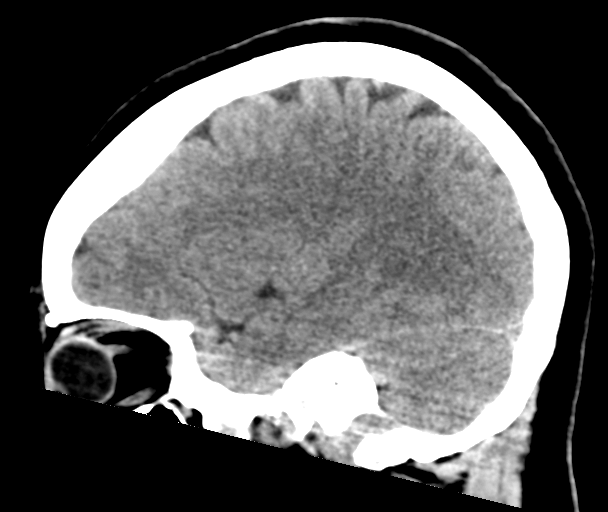

[16 of 47 positions shown; findings below may reference images not displayed]

FINDINGS: Brain: No evidence of acute infarction, hemorrhage, hydrocephalus,
extra-axial collection or mass lesion/mass effect.

Vascular: No hyperdense vessel or unexpected calcification.

Skull: Normal. Negative for fracture or focal lesion.

Sinuses/Orbits: There is moderate mucosal thickening involving the
sphenoid sinus. Mastoid air cells are clear.

Other: None
IMPRESSION: 1. No acute intracranial abnormalities.
2. Sphenoid sinus inflammation.

## 2022-08-29 ENCOUNTER — Other Ambulatory Visit: Payer: Self-pay

## 2022-08-29 ENCOUNTER — Emergency Department: Payer: 59

## 2022-08-29 ENCOUNTER — Emergency Department
Admission: EM | Admit: 2022-08-29 | Discharge: 2022-08-29 | Disposition: A | Discharge status: Home / Self Care | Payer: 59 | Attending: Emergency Medicine | Admitting: Emergency Medicine

## 2022-08-29 DIAGNOSIS — W010XXA Fall on same level from slipping, tripping and stumbling without subsequent striking against object, initial encounter: Secondary | ICD-10-CM | POA: Diagnosis not present

## 2022-08-29 DIAGNOSIS — W19XXXA Unspecified fall, initial encounter: Secondary | ICD-10-CM

## 2022-08-29 DIAGNOSIS — Y92096 Garden or yard of other non-institutional residence as the place of occurrence of the external cause: Secondary | ICD-10-CM | POA: Insufficient documentation

## 2022-08-29 DIAGNOSIS — S7002XA Contusion of left hip, initial encounter: Secondary | ICD-10-CM | POA: Diagnosis not present

## 2022-08-29 DIAGNOSIS — S79912A Unspecified injury of left hip, initial encounter: Secondary | ICD-10-CM | POA: Diagnosis present

## 2022-08-29 LAB — POC URINE PREG, ED: Preg Test, Ur: NEGATIVE

## 2022-08-29 MED ORDER — HYDROCODONE-ACETAMINOPHEN 5-325 MG PO TABS
1.0000 | ORAL_TABLET | Freq: Once | ORAL | Status: AC
  Administered 2022-08-29: 1 via ORAL
  Filled 2022-08-29: qty 1

## 2022-08-29 MED ORDER — LIDOCAINE 5 % EX PTCH: 1.0000 | MEDICATED_PATCH | Freq: Two times a day (BID) | CUTANEOUS | 0 refills | Status: AC | PRN

## 2022-08-29 MED ORDER — IBUPROFEN 800 MG PO TABS: 800.0000 mg | ORAL_TABLET | Freq: Three times a day (TID) | ORAL | 0 refills | Status: AC | PRN

## 2022-08-29 MED ORDER — CYCLOBENZAPRINE HCL 10 MG PO TABS
10.0000 mg | ORAL_TABLET | Freq: Three times a day (TID) | ORAL | 0 refills | Status: AC | PRN
Start: 2022-08-29 — End: 2022-09-03

## 2022-08-29 MED ORDER — ONDANSETRON 4 MG PO TBDP
4.0000 mg | ORAL_TABLET | Freq: Once | ORAL | Status: AC
  Administered 2022-08-29: 4 mg via ORAL
  Filled 2022-08-29: qty 1

## 2022-08-29 NOTE — ED Provider Notes (Signed)
[facility] Emergency Department Provider Note     Event Date/Time   First MD Initiated Contact with Patient 08/29/22 1815     (approximate)   History   Fall   HPI  Jill Gardner is a 42 y.o. female with a noncontributory medical history, presents to the ED for evaluation of a mechanical fall.  Patient reports he was working in the yard, and stepped in a divot in the ER, causing her to fall landing on her left upper leg and buttocks.  She denies any head injury or LOC.  She reports pain and disability to the left upper hip.  Physical Exam   Triage Vital Signs: ED Triage Vitals  Enc Vitals Group     BP 08/29/22 1712 121/69     Pulse Rate 08/29/22 1712 (!) 109     Resp 08/29/22 1712 16     Temp 08/29/22 1710 98.8 F (37.1 C)     Temp src --      SpO2 08/29/22 1712 98 %     Weight 08/29/22 1711 148 lb (67.1 kg)     Height 08/29/22 1711 5\' 3"  (1.6 m)     Head Circumference --      Peak Flow --      Pain Score 08/29/22 1710 10     Pain Loc --      Pain Edu? --      Excl. in GC? --     Most recent vital signs: Vitals:   08/29/22 1710 08/29/22 1712  BP:  121/69  Pulse:  (!) 109  Resp:  16  Temp: 98.8 F (37.1 C)   SpO2:  98%    General Awake, no distress. NAD HEENT NCAT. PERRL. EOMI. No rhinorrhea. Mucous membranes are moist.  CV:  Good peripheral perfusion.  RESP:  Normal effort.  ABD:  No distention.  MSK:  Normal spinal alignment without midline tenderness, spasm, deformity, or step-off.  Patient is to palpation to the left sacroiliac and gluteal region.  Active range of motion to the left hip with flexion and extension limited only by pain.  No obvious deformity, fixed rotation, or leg length discrepancy is appreciated.  ED Results / Procedures / Treatments   Labs (all labs ordered are listed, but only abnormal results are displayed) Labs Reviewed  POC URINE PREG, ED    EKG  RADIOLOGY  I personally viewed and evaluated  these images as part of my medical decision making, as well as reviewing the written report by the radiologist.  ED Provider Interpretation: No acute findings  DG Hip Unilat W or Wo Pelvis 2-3 Views Left  Result Date: 08/29/2022 CLINICAL DATA:  Fall injury EXAM: DG HIP (WITH OR WITHOUT PELVIS) 2-3V LEFT COMPARISON:  None Available. FINDINGS: There is no evidence of hip fracture or dislocation. There is no evidence of arthropathy or other focal bone abnormality. IMPRESSION: Negative. Electronically Signed   By: Jasmine Pang M.D.   On: 08/29/2022 19:41     PROCEDURES:  Critical Care performed: No  Procedures   MEDICATIONS ORDERED IN ED: Medications  ondansetron (ZOFRAN-ODT) disintegrating tablet 4 mg (4 mg Oral Given 08/29/22 1846)  HYDROcodone-acetaminophen (NORCO/VICODIN) 5-325 MG per tablet 1 tablet (1 tablet Oral Given 08/29/22 1846)     IMPRESSION / MDM / ASSESSMENT AND PLAN / ED COURSE  I reviewed the triage vital signs and the nursing notes.  Differential diagnosis includes, but is not limited to, hip sprain, hip contusion, trochanteric bursitis, hematoma, hip fracture  Patient's presentation is most consistent with acute complicated illness / injury requiring diagnostic workup.  Patient's diagnosis is consistent with hip contusion on the left.  No radiologic evidence of any acute fracture or dislocation to the hip based on interpretation of images.  Patient will be discharged home with prescriptions for ibuprofen and cyclobenzaprine and Lidoderm patches. Patient is to follow up with orthopedics as needed or otherwise directed. Patient is given ED precautions to return to the ED for any worsening or new symptoms.  FINAL CLINICAL IMPRESSION(S) / ED DIAGNOSES   Final diagnoses:  Fall in home, initial encounter  Contusion of left hip, initial encounter     Rx / DC Orders   ED Discharge Orders          Ordered    cyclobenzaprine (FLEXERIL) 10  MG tablet  3 times daily PRN        08/29/22 2009    ibuprofen (ADVIL) 800 MG tablet  Every 8 hours PRN        08/29/22 2009    lidocaine (LIDODERM) 5 %  Every 12 hours PRN        08/29/22 2009             Note:  This document was prepared using Dragon voice recognition software and may include unintentional dictation errors.    [name], PA-C 08/29/22 2122    Minna Antis, MD 08/30/22 [phone]

## 2022-08-29 NOTE — Discharge Instructions (Signed)
Your exam and x-ray are normal at this time.  Signs of any fracture or dislocation to the left hip or pelvis.  You likely have symptoms of contusion to the muscle and gluteal region.  Take the prescription meds as directed.  Apply moist heat to reduce symptoms.  Follow with your primary provider return to ED if needed.

## 2022-08-29 NOTE — ED Triage Notes (Signed)
Pt to ED for fall in backyard, tripped in hole. C/o pain to left upper leg.

## 2022-09-20 ENCOUNTER — Emergency Department
Admission: EM | Admit: 2022-09-20 | Discharge: 2022-09-20 | Disposition: A | Discharge status: Home / Self Care | Payer: 59 | Attending: Emergency Medicine | Admitting: Emergency Medicine

## 2022-09-20 DIAGNOSIS — Z20822 Contact with and (suspected) exposure to covid-19: Secondary | ICD-10-CM | POA: Diagnosis not present

## 2022-09-20 DIAGNOSIS — J019 Acute sinusitis, unspecified: Secondary | ICD-10-CM | POA: Diagnosis not present

## 2022-09-20 DIAGNOSIS — D649 Anemia, unspecified: Secondary | ICD-10-CM | POA: Insufficient documentation

## 2022-09-20 DIAGNOSIS — B349 Viral infection, unspecified: Secondary | ICD-10-CM

## 2022-09-20 DIAGNOSIS — R519 Headache, unspecified: Secondary | ICD-10-CM | POA: Diagnosis present

## 2022-09-20 LAB — CBC WITH DIFFERENTIAL/PLATELET
Abs Immature Granulocytes: 0.02 10*3/uL (ref 0.00–0.07)
Basophils Absolute: 0.1 10*3/uL (ref 0.0–0.1)
Basophils Relative: 1 %
Eosinophils Absolute: 0.3 10*3/uL (ref 0.0–0.5)
Eosinophils Relative: 3 %
HCT: 33.2 % — ABNORMAL LOW (ref 36.0–46.0)
Hemoglobin: 10.5 g/dL — ABNORMAL LOW (ref 12.0–15.0)
Immature Granulocytes: 0 %
Lymphocytes Relative: 24 %
Lymphs Abs: 1.9 10*3/uL (ref 0.7–4.0)
MCH: 28.5 pg (ref 26.0–34.0)
MCHC: 31.6 g/dL (ref 30.0–36.0)
MCV: 90.2 fL (ref 80.0–100.0)
Monocytes Absolute: 0.9 10*3/uL (ref 0.1–1.0)
Monocytes Relative: 11 %
Neutro Abs: 4.9 10*3/uL (ref 1.7–7.7)
Neutrophils Relative %: 61 %
Platelets: 384 10*3/uL (ref 150–400)
RBC: 3.68 MIL/uL — ABNORMAL LOW (ref 3.87–5.11)
RDW: 18 % — ABNORMAL HIGH (ref 11.5–15.5)
WBC: 8 10*3/uL (ref 4.0–10.5)
nRBC: 0 % (ref 0.0–0.2)

## 2022-09-20 LAB — URINALYSIS, ROUTINE W REFLEX MICROSCOPIC
Bilirubin Urine: NEGATIVE
Glucose, UA: NEGATIVE mg/dL
Hgb urine dipstick: NEGATIVE
Ketones, ur: NEGATIVE mg/dL
Leukocytes,Ua: NEGATIVE
Nitrite: NEGATIVE
Protein, ur: NEGATIVE mg/dL
Specific Gravity, Urine: 1.021 (ref 1.005–1.030)
pH: 5 (ref 5.0–8.0)

## 2022-09-20 LAB — COMPREHENSIVE METABOLIC PANEL
ALT: 42 U/L (ref 0–44)
AST: 26 U/L (ref 15–41)
Albumin: 3.8 g/dL (ref 3.5–5.0)
Alkaline Phosphatase: 66 U/L (ref 38–126)
Anion gap: 8 (ref 5–15)
BUN: 13 mg/dL (ref 6–20)
CO2: 25 mmol/L (ref 22–32)
Calcium: 8.7 mg/dL — ABNORMAL LOW (ref 8.9–10.3)
Chloride: 103 mmol/L (ref 98–111)
Creatinine, Ser: 0.57 mg/dL (ref 0.44–1.00)
GFR, Estimated: >60 mL/min (ref >60)
Glucose, Bld: 99 mg/dL (ref 70–99)
Potassium: 3.5 mmol/L (ref 3.5–5.1)
Sodium: 136 mmol/L (ref 135–145)
Total Bilirubin: 0.4 mg/dL (ref 0.3–1.2)
Total Protein: 6.8 g/dL (ref 6.5–8.1)

## 2022-09-20 LAB — T4, FREE: Free T4: 0.89 ng/dL (ref 0.61–1.12)

## 2022-09-20 LAB — POC URINE PREG, ED: Preg Test, Ur: NEGATIVE

## 2022-09-20 LAB — SARS CORONAVIRUS 2 BY RT PCR: SARS Coronavirus 2 by RT PCR: NEGATIVE

## 2022-09-20 LAB — CK: Total CK: 48 U/L (ref 38–234)

## 2022-09-20 LAB — LIPASE, BLOOD: Lipase: 51 U/L (ref 11–51)

## 2022-09-20 LAB — TSH: TSH: 2.019 u[IU]/mL (ref 0.350–4.500)

## 2022-09-20 MED ORDER — ONDANSETRON 4 MG PO TBDP
4.0000 mg | ORAL_TABLET | Freq: Once | ORAL | Status: AC
  Administered 2022-09-20: 4 mg via ORAL
  Filled 2022-09-20: qty 1

## 2022-09-20 MED ORDER — DIPHENHYDRAMINE HCL 50 MG/ML IJ SOLN
12.5000 mg | Freq: Once | INTRAMUSCULAR | Status: AC
  Administered 2022-09-20: 12.5 mg via INTRAVENOUS
  Filled 2022-09-20: qty 1

## 2022-09-20 MED ORDER — AMOXICILLIN-POT CLAVULANATE 875-125 MG PO TABS: 1.0000 | ORAL_TABLET | Freq: Two times a day (BID) | ORAL | 0 refills | Status: AC

## 2022-09-20 MED ORDER — ACETAMINOPHEN 500 MG PO TABS
1000.0000 mg | ORAL_TABLET | Freq: Once | ORAL | Status: AC
  Administered 2022-09-20: 1000 mg via ORAL
  Filled 2022-09-20: qty 2

## 2022-09-20 MED ORDER — METOCLOPRAMIDE HCL 5 MG/ML IJ SOLN
10.0000 mg | Freq: Once | INTRAMUSCULAR | Status: AC
  Administered 2022-09-20: 10 mg via INTRAVENOUS
  Filled 2022-09-20: qty 2

## 2022-09-20 MED ORDER — SODIUM CHLORIDE 0.9 % IV BOLUS
1000.0000 mL | Freq: Once | INTRAVENOUS | Status: AC
  Administered 2022-09-20: 1000 mL via INTRAVENOUS

## 2022-09-20 NOTE — ED Provider Triage Note (Signed)
Emergency Medicine Provider Triage Evaluation Note  Jill Gardner, a 42 y.o. female  was evaluated in triage.  Pt complains 2 days of generalized bodyaches, headache, nausea and generalized abdominal pain patient denies any frank fevers but reports chills.  Denies any sick contacts, recent travel, or bad food exposure.  Review of Systems  Positive: Body aches, headache, abdominal pain Negative: FCS  Physical Exam  BP 115/80 (BP Location: Left Arm)   Pulse 87   Temp 98.4 F (36.9 C) (Oral)   Resp 16   Ht 5\' 3"  (1.6 m)   Wt 65.8 kg   LMP 08/07/2022   SpO2 98%   BMI 25.69 kg/m  Gen:   Awake, no distress  NAD Resp:  Normal effort CTA MSK:   Moves extremities without difficulty  Other:    Medical Decision Making  Medically screening exam initiated at 7:57 PM.  Appropriate orders placed.  Jill Gardner was informed that the remainder of the evaluation will be completed by another provider, this initial triage assessment does not replace that evaluation, and the importance of remaining in the ED until their evaluation is complete.  Patient to the ED for evaluation of generalized bodyaches, headache and nausea.  She reports 2 days worth of symptoms.   Jill Hoard, PA-C 09/20/22 1958

## 2022-09-20 NOTE — ED Provider Notes (Signed)
[facility] ([facility]) Provider Note    Event Date/Time   First MD Initiated Contact with Patient 09/20/22 2020     (approximate)   History   Generalized Body Aches   HPI  Jill Gardner is a 42 y.o. female who comes in with multiple symptoms.  Patient reports that she feels like she might have COVID.  She reports that she has had 7 days of bodyaches, headaches, nausea without vomiting, generalized cramping.  She denies any sick contacts or any fevers.  She states that her headaches are not severe in onset they have just been gradually worsening.  Denies any vision changes, waking her up from sleep, vomiting, headaches waking her up from nighttime.  Reports like it is a pressure in her sinuses.  Denies any significant nasal discharge.  She denies any chest pain, shortness of breath, cough.  Physical Exam   Triage Vital Signs: ED Triage Vitals  Enc Vitals Group     BP 09/20/22 1935 115/80     Pulse Rate 09/20/22 1935 87     Resp 09/20/22 1935 16     Temp 09/20/22 1935 98.4 F (36.9 C)     Temp Source 09/20/22 1935 Oral     SpO2 09/20/22 1935 98 %     Weight 09/20/22 1938 145 lb (65.8 kg)     Height 09/20/22 1938 5\' 3"  (1.6 m)     Head Circumference --      Peak Flow --      Pain Score 09/20/22 1938 1     Pain Loc --      Pain Edu? --      Excl. in GC? --     Most recent vital signs: Vitals:   09/20/22 1935  BP: 115/80  Pulse: 87  Resp: 16  Temp: 98.4 F (36.9 C)  SpO2: 98%     General: Awake, no distress.  CV:  Good peripheral perfusion.  Resp:  Normal effort.  Abd:  No distention.  Soft nontender Other:  Patient has frontal and nasal sinus tenderness.  Her extraocular movements are intact.  Cranial nerves are intact.  Equal strength in arms and legs.  Pupils equal round and reactive.   ED Results / Procedures / Treatments   Labs (all labs ordered are listed, but only abnormal results are displayed) Labs Reviewed  CBC WITH  DIFFERENTIAL/PLATELET - Abnormal; Notable for the following components:      Result Value   RBC 3.68 (*)    Hemoglobin 10.5 (*)    HCT 33.2 (*)    RDW 18.0 (*)    All other components within normal limits  COMPREHENSIVE METABOLIC PANEL - Abnormal; Notable for the following components:   Calcium 8.7 (*)    All other components within normal limits  URINALYSIS, ROUTINE W REFLEX MICROSCOPIC - Abnormal; Notable for the following components:   Color, Urine YELLOW (*)    APPearance CLEAR (*)    All other components within normal limits  SARS CORONAVIRUS 2 BY RT PCR  LIPASE, BLOOD  TSH  T4, FREE  CK  POC URINE PREG, ED      PROCEDURES:  Critical Care performed: No  Procedures   MEDICATIONS ORDERED IN ED: Medications  acetaminophen (TYLENOL) tablet 1,000 mg (1,000 mg Oral Given 09/20/22 2137)  sodium chloride 0.9 % bolus 1,000 mL (1,000 mLs Intravenous New Bag/Given 09/20/22 2134)  metoCLOPramide (REGLAN) injection 10 mg (10 mg Intravenous Given 09/20/22 2127)  diphenhydrAMINE (BENADRYL) injection 12.5  mg (12.5 mg Intravenous Given 09/20/22 2126)     IMPRESSION / MDM / ASSESSMENT AND PLAN / ED COURSE  I reviewed the triage vital signs and the nursing notes.   Patient's presentation is most consistent with acute presentation with potential threat to life or bodily function.   Patient comes in with multiple symptoms that seems more viral in nature but her COVID test was negative and she reports has been going on for 7 days so we discussed doing some basic blood work and she would like to proceed with this.  Her headache seems to be gradual in onset her neuroexam is intact.  We discussed CT imaging and she had a CT scan done back in 2022 without evidence of brain mass.  Given no red flag symptoms she would like to hold off on CT imaging at this time. Does not sound liek SAH and with multiple other symptoms seems more likely related to viral illness.  Preg test was negative.  CBC  shows hemoglobin of 10.5 but she reports having a history of that previously Lipase was normal thyroid was normal urine without evidence of UTI CK normal  Patient was given migraine cocktail.  Evaluation patient reports feeling much better.  We again discussed CT imaging but she again did not want to do it which again given her multiple symptoms and no red flag symptoms I think is very reasonable.  Given all of her symptoms in the frontal and maxillary sinus pressure I wonder if she could be developing like a sinusitis.  We discussed that if she is not getting better next couple days that we can start her on some Augmentin.  Also prescribe some Zofran to help we discussed using Tylenol, ibuprofen for any discomfort and to monitor symptoms and return to the ER symptoms are worsening or follow-up with her primary care doctor.  She expressed understanding and felt comfortable with this plan  FINAL CLINICAL IMPRESSION(S) / ED DIAGNOSES   Final diagnoses:  Acute sinusitis, recurrence not specified, unspecified location  Viral syndrome     Rx / DC Orders   ED Discharge Orders          Ordered    amoxicillin-clavulanate (AUGMENTIN) 875-125 MG tablet  2 times daily        09/20/22 2301             Note:  This document was prepared using Dragon voice recognition software and may include unintentional dictation errors.   Concha Se, MD 09/20/22 [phone]

## 2022-09-20 NOTE — ED Triage Notes (Signed)
Ambulatory to triage with c/o body aches, headache, nausea, and abd pain. Denies fever at home, denies congestion. Provider in triage for further eval at this time

## 2022-09-20 NOTE — Discharge Instructions (Addendum)
Your workup was reassuring we discussed CT imaging.  No red flag symptoms of opted to hold off.  However this could just be like some sinus pressure, sinusitis, viral illness.  Will start you on some antibiotics I do return to the ER if you develop any worsening symptoms fevers or any other concerns

## 2022-10-10 ENCOUNTER — Other Ambulatory Visit: Payer: Self-pay

## 2022-10-10 ENCOUNTER — Emergency Department: Payer: 59

## 2022-10-10 ENCOUNTER — Emergency Department
Admission: EM | Admit: 2022-10-10 | Discharge: 2022-10-10 | Disposition: A | Discharge status: Home / Self Care | Payer: 59 | Attending: Emergency Medicine | Admitting: Emergency Medicine

## 2022-10-10 ENCOUNTER — Encounter: Payer: Self-pay | Admitting: Emergency Medicine

## 2022-10-10 DIAGNOSIS — W1840XA Slipping, tripping and stumbling without falling, unspecified, initial encounter: Secondary | ICD-10-CM | POA: Insufficient documentation

## 2022-10-10 DIAGNOSIS — S92354A Nondisplaced fracture of fifth metatarsal bone, right foot, initial encounter for closed fracture: Secondary | ICD-10-CM | POA: Diagnosis not present

## 2022-10-10 DIAGNOSIS — S99921A Unspecified injury of right foot, initial encounter: Secondary | ICD-10-CM | POA: Diagnosis present

## 2022-10-10 MED ORDER — NAPROXEN 500 MG PO TABS: 500.0000 mg | ORAL_TABLET | Freq: Two times a day (BID) | ORAL | 0 refills | Status: AC

## 2022-10-10 NOTE — Discharge Instructions (Signed)
There is a possible nondisplaced fracture of your fifth metatarsal.  Please wear the boot for extra support.  Please follow-up with orthopedics.  Please keep your foot elevated, apply ice for 20 minutes at a time several times per day.  Return for any new, worsening, or change in symptoms or other concerns.  It was a pleasure caring for you today.

## 2022-10-10 NOTE — ED Notes (Signed)
See triage note  Presents with pain to right foot/ankle  States she tripped over toy last pm  No deformity noted   Good pulses

## 2022-10-10 NOTE — ED Provider Notes (Signed)
[facility] Provider Note    Event Date/Time   First MD Initiated Contact with Patient 10/10/22 [phone]     (approximate)   History   Ankle Pain   HPI  Jill Gardner is a 42 y.o. female with no reported past medical history presents today for evaluation of right foot pain.  Patient reports that she went outside at approximately 9 PM last night and tripped over her grandson's toy and twisted her ankle.  She reports that she was able to get back up and walk, though today she reports that she is not able to bear weight.  No numbness or tingling.  There are no problems to display for this patient.         Physical Exam   Triage Vital Signs: ED Triage Vitals [10/10/22 0704]  Enc Vitals Group     BP 118/85     Pulse Rate 93     Resp 18     Temp 98.4 F (36.9 C)     Temp Source Oral     SpO2 99 %     Weight 145 lb 1 oz (65.8 kg)     Height 5\' 3"  (1.6 m)     Head Circumference      Peak Flow      Pain Score 10     Pain Loc      Pain Edu?      Excl. in GC?     Most recent vital signs: Vitals:   10/10/22 0704  BP: 118/85  Pulse: 93  Resp: 18  Temp: 98.4 F (36.9 C)  SpO2: 99%    Physical Exam Vitals and nursing note reviewed.  Constitutional:      General: Awake and alert. No acute distress.    Appearance: Normal appearance. The patient is normal weight.  HENT:     Head: Normocephalic and atraumatic.     Mouth: Mucous membranes are moist.  Eyes:     General: PERRL. Normal EOMs        Right eye: No discharge.        Left eye: No discharge.     Conjunctiva/sclera: Conjunctivae normal.  Cardiovascular:     Rate and Rhythm: Normal rate and regular rhythm.     Pulses: Normal pulses.  Pulmonary:     Effort: Pulmonary effort is normal. No respiratory distress.     Breath sounds: Normal breath sounds.  Abdominal:     Abdomen is soft. There is no abdominal tenderness. No rebound or guarding. No distention. Musculoskeletal:         General: No swelling. Normal range of motion.     Cervical back: Normal range of motion and neck supple.  Right ankle: Tenderness and swelling over the anterior talofibular ligament and posterior to lateral malleolus, no lateral or medial malleolar tenderness. There is proximal fifth metacarpal tenderness and TTP over the top of the lateral foot. No proximal fibular tenderness. 2+ pedal pulses with brisk capillary refill. Intact distal sensation and strength with normal ROM. Able to plantar flex and dorsiflex against resistance. Able to invert and evert against resistance. Negative dorsiflexion external rotation test. Negative squeeze test. Negative Thompson test Skin:    General: Skin is warm and dry.     Capillary Refill: Capillary refill takes less than 2 seconds.     Findings: No rash.  Neurological:     Mental Status: The patient is awake and alert.      ED Results /  Procedures / Treatments   Labs (all labs ordered are listed, but only abnormal results are displayed) Labs Reviewed - No data to display   EKG     RADIOLOGY I independently reviewed and interpreted imaging and agree with radiologists findings.     PROCEDURES:  Critical Care performed:   Procedures   MEDICATIONS ORDERED IN ED: Medications - No data to display   IMPRESSION / MDM / ASSESSMENT AND PLAN / ED COURSE  I reviewed the triage vital signs and the nursing notes.   Differential diagnosis includes, but is not limited to, fracture, sprain, contusion.  Patient is awake and alert, hemodynamically stable and neurovascularly intact with normal 2+ distal pulses, normal capillary refill, and intact sensation, equal to opposite.   Patient has swelling over the ATFL and top of foot.  Therefore per [facility] rules x-ray was obtained.  Patient is able to wiggle all toes, has normal capillary refill, normal distal pulses.  Compartments are soft and compressible throughout.  There is no tenderness to proximal  fibula that would be concerning for occult fracture.  There is no knee pain or swelling and no ligamental laxity, do not suspect knee injury.  Negative squeeze test so do not suspect high ankle sprain.  Negative Thompson test, do not suspect Achilles tendon rupture.   Patient has tenderness over her proximal fifth metatarsal.  X-ray demonstrates a possible nondisplaced fracture in this location.  Patient was given a cam boot.  We discussed [place] and the importance of orthopedic follow-up given the location of her injury.  Patient understands and agrees with plan.    Patient's presentation is most consistent with acute complicated illness / injury requiring diagnostic workup.    FINAL CLINICAL IMPRESSION(S) / ED DIAGNOSES   Final diagnoses:  Closed nondisplaced fracture of fifth metatarsal bone of right foot, initial encounter     Rx / DC Orders   ED Discharge Orders          Ordered    naproxen (NAPROSYN) 500 MG tablet  2 times daily with meals        10/10/22 0907             Note:  This document was prepared using Dragon voice recognition software and may include unintentional dictation errors.   Keturah Shavers 10/10/22 0919    Jene Every, MD 10/10/22 [phone]

## 2022-10-10 NOTE — ED Triage Notes (Signed)
Pt here with a right ankle injury that happened last night. Pt went outside and tripped over a toy and fell and tested her ankle. Pt states this morning she has not been able to bear weight on that ankle and she states she cannot feel her toes.

## 2022-12-30 ENCOUNTER — Other Ambulatory Visit: Payer: Self-pay

## 2022-12-30 ENCOUNTER — Emergency Department
Admission: EM | Admit: 2022-12-30 | Discharge: 2022-12-30 | Disposition: A | Discharge status: Home / Self Care | Payer: 59 | Attending: Emergency Medicine | Admitting: Emergency Medicine

## 2022-12-30 ENCOUNTER — Encounter: Payer: Self-pay | Admitting: Emergency Medicine

## 2022-12-30 DIAGNOSIS — T148XXA Other injury of unspecified body region, initial encounter: Secondary | ICD-10-CM

## 2022-12-30 DIAGNOSIS — R221 Localized swelling, mass and lump, neck: Secondary | ICD-10-CM

## 2022-12-30 DIAGNOSIS — S161XXA Strain of muscle, fascia and tendon at neck level, initial encounter: Secondary | ICD-10-CM | POA: Insufficient documentation

## 2022-12-30 DIAGNOSIS — X500XXA Overexertion from strenuous movement or load, initial encounter: Secondary | ICD-10-CM | POA: Insufficient documentation

## 2022-12-30 MED ORDER — LIDOCAINE 5 % EX PTCH
1.0000 | MEDICATED_PATCH | CUTANEOUS | Status: DC
  Administered 2022-12-30: 1 via TRANSDERMAL
  Filled 2022-12-30: qty 1

## 2022-12-30 MED ORDER — PREDNISONE 10 MG PO TABS: 10.0000 mg | ORAL_TABLET | Freq: Every day | ORAL | 0 refills | Status: AC

## 2022-12-30 MED ORDER — CYCLOBENZAPRINE HCL 5 MG PO TABS: 5.0000 mg | ORAL_TABLET | Freq: Three times a day (TID) | ORAL | 0 refills | Status: AC | PRN

## 2022-12-30 MED ORDER — KETOROLAC TROMETHAMINE 30 MG/ML IJ SOLN
30.0000 mg | Freq: Once | INTRAMUSCULAR | Status: AC
  Administered 2022-12-30: 30 mg via INTRAMUSCULAR
  Filled 2022-12-30: qty 1

## 2022-12-30 NOTE — ED Provider Notes (Signed)
[facility] Emergency Department Provider Note     Event Date/Time   First MD Initiated Contact with Patient 12/30/22 1413     (approximate)   History   neck swelling   HPI  Jill Gardner is a 42 y.o. female to the ED with right side neck swelling noticed this morning.  Patient states she has never experienced this before.  No dental pain or trauma.  She denies sore throat or recent illnesses.  Patient reports yesterday she was lifting heavy weights and had the bar on the back of her neck in which this may have caused her symptoms today.  Pain is exacerbated with neck rotation.  Denies fever, muffled voice, and ear pain.     Physical Exam   Triage Vital Signs: ED Triage Vitals  Encounter Vitals Group     BP 12/30/22 1402 111/69     Systolic BP Percentile --      Diastolic BP Percentile --      Pulse Rate 12/30/22 1402 91     Resp 12/30/22 1402 18     Temp 12/30/22 1402 98 F (36.7 C)     Temp src --      SpO2 12/30/22 1402 97 %     Weight 12/30/22 1417 145 lb 1 oz (65.8 kg)     Height 12/30/22 1417 5' (1.524 m)     Head Circumference --      Peak Flow --      Pain Score 12/30/22 1402 7     Pain Loc --      Pain Education --      Exclude from Growth Chart --     Most recent vital signs: Vitals:   12/30/22 1402  BP: 111/69  Pulse: 91  Resp: 18  Temp: 98 F (36.7 C)  SpO2: 97%    General Well-appearing.  Awake, no distress.  HEENT NCAT. PERRL. EOMI. No rhinorrhea. Mucous membranes are moist.  Oropharynx is clear.  Uvula is midline.  Tonsils are not enlarged.  No dental abscess noted. CV:  Good peripheral perfusion.  RESP:  Normal effort.  LCTAB.  Airway is patent. ABD:  No distention.  Other:  There is a 2 cm lump on the right sternocleidomastoid muscle.  Tenderness with right neck rotation.  Tenderness to palpation.  No fluctuance.  Nonmobile.  Skin is normal.  ED Results / Procedures / Treatments   Labs (all labs ordered  are listed, but only abnormal results are displayed) Labs Reviewed - No data to display  No results found.  PROCEDURES:  Critical Care performed: No  Procedures   MEDICATIONS ORDERED IN ED: Medications  lidocaine (LIDODERM) 5 % 1 patch (1 patch Transdermal Patch Applied 12/30/22 1550)  ketorolac (TORADOL) 30 MG/ML injection 30 mg (30 mg Intramuscular Given 12/30/22 1551)     IMPRESSION / MDM / ASSESSMENT AND PLAN / ED COURSE  I reviewed the triage vital signs and the nursing notes.                                 42 y.o. female presents to the emergency department for evaluation and treatment of acute neck swelling. See HPI for further details.   Differential diagnosis includes, but is not limited to cervical muscle strain, abscess, lymphadenopathy, PTA, Ludwig's  Patient's presentation is most consistent with acute complicated illness / injury requiring diagnostic workup.  Patient's is  well-appearing, hemodynamically stable and has normal speech.  No history of recent illnesses to indicate strep pharyngitis or symptoms of strep.  The lump is not intraorally.  Given history of lifting heavy weights I do believe presentation is clinically consistent with a muscle strain in addition to pain with ipsilateral neck rotation.  Patient given Toradol and a lidocaine patch over this area and reported some relief which is reassuring.  Patient is in stable discharge for home.  We discussed if symptoms do not improve that imaging may be possible to rule out a soft tissue abscess.  Signs to look out for including fever are provided.  Patient will be discharged home with short course of steroids and muscle relaxer.  She is to follow-up with her primary care in 1 week's time. Patient verbalizes understanding. All questions and concerns were addressed during ED visit.    FINAL CLINICAL IMPRESSION(S) / ED DIAGNOSES   Final diagnoses:  Muscle strain  Neck swelling   Rx / DC Orders   ED  Discharge Orders          Ordered    cyclobenzaprine (FLEXERIL) 5 MG tablet  3 times daily PRN        12/30/22 1741    predniSONE (DELTASONE) 10 MG tablet  Daily        12/30/22 1745             Note:  This document was prepared using Dragon voice recognition software and may include unintentional dictation errors.    Romeo Apple, Aidee Latimore A, PA-C 12/30/22 2242    Chesley Noon, MD 12/31/22 [phone]

## 2022-12-30 NOTE — ED Triage Notes (Signed)
Pt comes with c/o neck swelling and pain. Pt states she woke up today and noticed it.

## 2022-12-30 NOTE — ED Notes (Signed)
See triage note  Presents with mi  swelling noted to right side of neck  States she noticed area this am

## 2023-01-17 ENCOUNTER — Encounter: Payer: Self-pay | Admitting: *Deleted

## 2023-01-17 ENCOUNTER — Emergency Department
Admission: EM | Admit: 2023-01-17 | Discharge: 2023-01-17 | Disposition: A | Discharge status: Home / Self Care | Payer: Medicaid Other | Attending: Emergency Medicine | Admitting: Emergency Medicine

## 2023-01-17 ENCOUNTER — Other Ambulatory Visit: Payer: Self-pay

## 2023-01-17 DIAGNOSIS — L0291 Cutaneous abscess, unspecified: Secondary | ICD-10-CM

## 2023-01-17 DIAGNOSIS — L02411 Cutaneous abscess of right axilla: Secondary | ICD-10-CM | POA: Insufficient documentation

## 2023-01-17 MED ORDER — LIDOCAINE HCL (PF) 1 % IJ SOLN
5.0000 mL | Freq: Once | INTRAMUSCULAR | Status: AC
  Administered 2023-01-17: 5 mL
  Filled 2023-01-17: qty 5

## 2023-01-17 MED ORDER — CEPHALEXIN 500 MG PO CAPS: 500.0000 mg | ORAL_CAPSULE | Freq: Two times a day (BID) | ORAL | 0 refills | Status: AC

## 2023-01-17 NOTE — ED Provider Notes (Signed)
[facility] Provider Note    Event Date/Time   First MD Initiated Contact with Patient 01/17/23 1511     (approximate)  History   Chief Complaint: Post-op Problem  HPI  Jill Gardner is a 42 y.o. female with no significant past medical history presents to the emergency department for an area of concern to her right axilla.  According to the patient in May she had liposuction performed of her upper arms.  She states over the last 1 week she has noticed a painful swollen area to her axilla where she believes they inserted the liposuction probe.  Patient denies any fever.  Patient is concerned that they possibly left a stitch in place here although denies seeing or feeling a stitch prior to this.  Physical Exam   Triage Vital Signs: ED Triage Vitals  Encounter Vitals Group     BP 01/17/23 1429 113/79     Systolic BP Percentile --      Diastolic BP Percentile --      Pulse Rate 01/17/23 1429 89     Resp 01/17/23 1429 16     Temp 01/17/23 1429 98.3 F (36.8 C)     Temp Source 01/17/23 1429 Oral     SpO2 01/17/23 1429 99 %     Weight --      Height --      Head Circumference --      Peak Flow --      Pain Score 01/17/23 1429 7     Pain Loc --      Pain Education --      Exclude from Growth Chart --     Most recent vital signs: Vitals:   01/17/23 1429  BP: 113/79  Pulse: 89  Resp: 16  Temp: 98.3 F (36.8 C)  SpO2: 99%    General: Awake, no distress.  CV:  Good peripheral perfusion. Resp:  Normal effort.   Abd:  No distention.   ED Results / Procedures / Treatments   MEDICATIONS ORDERED IN ED: Medications  lidocaine (PF) (XYLOCAINE) 1 % injection 5 mL (5 mLs Infiltration Given by Other 01/17/23 1523)     IMPRESSION / MDM / ASSESSMENT AND PLAN / ED COURSE  I reviewed the triage vital signs and the nursing notes.  Patient's presentation is most consistent with acute illness / injury with system symptoms.  Patient presents emergency  department for an area of tenderness and swelling to right axilla.  On examination patient has approximately 1 to 1.5 cm abscess to right axilla with fluctuance.  I was able to inject lidocaine to this area open the abscess up with 1 to 2 cc of exudative drainage.  No sign of any suture material within the abscess.  We will place patient on antibiotics Keflex twice daily for the next 7 days.  Discussed return and follow-up precautions.  INCISION AND DRAINAGE Performed by: Minna Antis Consent: Verbal consent obtained. Risks and benefits: risks, benefits and alternatives were discussed Type: abscess  Body area: Right axilla  Anesthesia: local infiltration  Incision was made with an 18-gauge needle  Local anesthetic: lidocaine 1% without epinephrine  Anesthetic total: 2 ml  Complexity: complex Blunt dissection to break up loculations  Drainage: purulent  Drainage amount: 1 to 2 cc  Patient tolerance: Patient tolerated the procedure well with no immediate complications.    FINAL CLINICAL IMPRESSION(S) / ED DIAGNOSES   abscess  Note:  This document was prepared using Dragon voice  recognition software and may include unintentional dictation errors.   Minna Antis, MD 01/17/23 1539

## 2023-01-17 NOTE — Discharge Instructions (Signed)
Please take antibiotic as prescribed for the next 2 days.  Please follow-up with your doctor for recheck/reevaluation.  Return to the emergency department for any increased pain swelling to the area or fever.

## 2023-01-17 NOTE — ED Triage Notes (Signed)
Pt states that she had liposuction in [place] on 09/06/2022 and she thinks they might have left a stitch in.  She states that she has a sore area in her right axilla.  No fever or chills with this.  Pt has small raised scar in area in question

## 2023-03-04 DIAGNOSIS — Z419 Encounter for procedure for purposes other than remedying health state, unspecified: Secondary | ICD-10-CM | POA: Diagnosis not present

## 2023-03-08 ENCOUNTER — Other Ambulatory Visit: Payer: Self-pay

## 2023-03-08 ENCOUNTER — Emergency Department
Admission: EM | Admit: 2023-03-08 | Discharge: 2023-03-08 | Disposition: A | Discharge status: Home / Self Care | Payer: Medicaid Other | Attending: Emergency Medicine | Admitting: Emergency Medicine

## 2023-03-08 DIAGNOSIS — J029 Acute pharyngitis, unspecified: Secondary | ICD-10-CM | POA: Insufficient documentation

## 2023-03-08 LAB — GROUP A STREP BY PCR: Group A Strep by PCR: NOT DETECTED

## 2023-03-08 MED ORDER — DEXAMETHASONE SODIUM PHOSPHATE 10 MG/ML IJ SOLN
10.0000 mg | Freq: Once | INTRAMUSCULAR | Status: AC
  Administered 2023-03-08: 10 mg via INTRAMUSCULAR
  Filled 2023-03-08: qty 1

## 2023-03-08 NOTE — ED Provider Notes (Signed)
[facility] Provider Note    Event Date/Time   First MD Initiated Contact with Patient 03/08/23 2155     (approximate)   History   Sore Throat    Jill Gardner is a 42 y.o. female with PMH of liposuction who presents for evaluation of sore throat ongoing for 3 weeks.  Patient describes it as being worse at night when laying down.  She says that she has had some voice hoarseness but no difficulty breathing she also endorses some pain with swallowing.  Patient is also concerned about possible abscess to the back of her right upper arm.  She was seen by this ED about a month ago and had it drained.  Prior to this she had liposuction in this area and was concerned that there was a stitch left.  She reports that it is painful and has not noticed it draining.      Physical Exam   Triage Vital Signs: ED Triage Vitals  Encounter Vitals Group     BP 03/08/23 2013 114/76     Systolic BP Percentile --      Diastolic BP Percentile --      Pulse Rate 03/08/23 2013 72     Resp 03/08/23 2013 18     Temp 03/08/23 2013 98.4 F (36.9 C)     Temp Source 03/08/23 2013 Oral     SpO2 03/08/23 2013 97 %     Weight 03/08/23 2016 144 lb (65.3 kg)     Height 03/08/23 2016 5\' 2"  (1.575 m)     Head Circumference --      Peak Flow --      Pain Score 03/08/23 2015 7     Pain Loc --      Pain Education --      Exclude from Growth Chart --     Most recent vital signs: Vitals:   03/08/23 2013  BP: 114/76  Pulse: 72  Resp: 18  Temp: 98.4 F (36.9 C)  SpO2: 97%    General: Awake, no distress.  CV:  Good peripheral perfusion.  Resp:  Normal effort.  Tolerating secretions, speaking in full sentences. Abd:  No distention.  Other:  Oral mucous membranes are moist, no pharyngeal erythema, no tonsillar enlargement or exudates.  Uvula is midline.  There are some palpable anterior cervical lymph nodes. Right upper arm: There is a small raised nodule, with a small vesicle  filled with clear fluid, mildly tender to palpation, no fluctuance or induration.  No surrounding erythema, swelling or warmth.   ED Results / Procedures / Treatments   Labs (all labs ordered are listed, but only abnormal results are displayed) Labs Reviewed  GROUP A STREP BY PCR    PROCEDURES:  Critical Care performed: No  Procedures   MEDICATIONS ORDERED IN ED: Medications  dexamethasone (DECADRON) injection 10 mg (10 mg Intramuscular Given 03/08/23 2320)     IMPRESSION / MDM / ASSESSMENT AND PLAN / ED COURSE  I reviewed the triage vital signs and the nursing notes.                              Differential diagnosis includes, but is not limited to, strep pharyngitis, viral pharyngitis, GERD, postnasal drip, abscess, cellulitis, wound infection.  Patient's presentation is most consistent with acute, uncomplicated illness.  Patient does have what appears to be a healing abscess on the back of her right  arm.  There is no surrounding erythema, warmth or swelling so my suspicion for cellulitis is low.  The area itself is not fluctuant or indurated, I do not believe that there is a fluid collection that needs to be drained.  She does have a small vesicle where it looks like it has been draining, I recommended that she apply warm compresses to encourage this to continue to drain.   Patient tested negative for strep.  She denies history of nasal congestion making postnasal drip less likely.  I do not see any signs concerning for peritonsillar abscess or retropharyngeal abscess as patient does not have any trismus, there is no fever, no tonsillar enlargement or exudates.  I gave patient a dose of Decadron to help with the localized inflammation and recommended that she follow-up with ENT.  She can also have Tylenol and ibuprofen as needed for pain.  Patient explained that she has an appointment with her primary care on Thursday.  I advised her to discuss these complaints at her PCP  visit. I told her to keep an eye on the abscess site and what to look for in terms of an infection.   She voiced understanding, all questions were answered and she was stable at discharge.      FINAL CLINICAL IMPRESSION(S) / ED DIAGNOSES   Final diagnoses:  Sore throat     Rx / DC Orders   ED Discharge Orders     None        Note:  This document was prepared using Dragon voice recognition software and may include unintentional dictation errors.   Cameron Ali, PA-C 03/08/23 2339    Chesley Noon, MD 03/14/23 [phone]

## 2023-03-08 NOTE — ED Triage Notes (Signed)
Pt presents to ER with c/o sore throat that has been ongoing for last 3 weeks and is worse at night when laying down for bed.  Pt also reports abscess to back of her arm that she states was drained her appx one month ago.  States abscess has not been draining.  Pt is otherwise A&O x4 and in NAD at this time.

## 2023-03-08 NOTE — Discharge Instructions (Addendum)
Please keep an eye on the spot on your arm.  Watch for surrounding redness, warmth, swelling and pain.  If you develop these please return to the ED as this is concerning for an infection.  As of right now I do see any evidence of this.  It does look like it has began to drain on its own, you can encourage this to continue by applying a warm compress.  You can take Tylenol and ibuprofen as needed for the pain caused by this spot and also for your throat.  Please call Dr. Mikey Bussing office and set up an appointment.

## 2023-03-10 DIAGNOSIS — R5383 Other fatigue: Secondary | ICD-10-CM | POA: Diagnosis not present

## 2023-03-10 DIAGNOSIS — E7800 Pure hypercholesterolemia, unspecified: Secondary | ICD-10-CM | POA: Diagnosis not present

## 2023-03-10 DIAGNOSIS — F419 Anxiety disorder, unspecified: Secondary | ICD-10-CM | POA: Diagnosis not present

## 2023-03-10 DIAGNOSIS — R7302 Impaired glucose tolerance (oral): Secondary | ICD-10-CM | POA: Diagnosis not present

## 2023-03-10 DIAGNOSIS — D539 Nutritional anemia, unspecified: Secondary | ICD-10-CM | POA: Diagnosis not present

## 2023-03-10 DIAGNOSIS — Z6829 Body mass index (BMI) 29.0-29.9, adult: Secondary | ICD-10-CM | POA: Diagnosis not present

## 2023-03-10 DIAGNOSIS — Z79899 Other long term (current) drug therapy: Secondary | ICD-10-CM | POA: Diagnosis not present

## 2023-03-10 DIAGNOSIS — E559 Vitamin D deficiency, unspecified: Secondary | ICD-10-CM | POA: Diagnosis not present

## 2023-03-10 DIAGNOSIS — J019 Acute sinusitis, unspecified: Secondary | ICD-10-CM | POA: Diagnosis not present

## 2023-04-03 DIAGNOSIS — Z419 Encounter for procedure for purposes other than remedying health state, unspecified: Secondary | ICD-10-CM | POA: Diagnosis not present

## 2023-05-04 DIAGNOSIS — Z419 Encounter for procedure for purposes other than remedying health state, unspecified: Secondary | ICD-10-CM | POA: Diagnosis not present

## 2023-06-04 DIAGNOSIS — Z419 Encounter for procedure for purposes other than remedying health state, unspecified: Secondary | ICD-10-CM | POA: Diagnosis not present

## 2023-07-02 DIAGNOSIS — Z419 Encounter for procedure for purposes other than remedying health state, unspecified: Secondary | ICD-10-CM | POA: Diagnosis not present

## 2023-07-20 DIAGNOSIS — E039 Hypothyroidism, unspecified: Secondary | ICD-10-CM | POA: Diagnosis not present

## 2023-07-20 DIAGNOSIS — L309 Dermatitis, unspecified: Secondary | ICD-10-CM | POA: Diagnosis not present

## 2023-07-20 DIAGNOSIS — N390 Urinary tract infection, site not specified: Secondary | ICD-10-CM | POA: Diagnosis not present

## 2023-07-20 DIAGNOSIS — Z79899 Other long term (current) drug therapy: Secondary | ICD-10-CM | POA: Diagnosis not present

## 2023-07-20 DIAGNOSIS — E559 Vitamin D deficiency, unspecified: Secondary | ICD-10-CM | POA: Diagnosis not present

## 2023-07-20 DIAGNOSIS — R300 Dysuria: Secondary | ICD-10-CM | POA: Diagnosis not present

## 2023-07-20 DIAGNOSIS — E7800 Pure hypercholesterolemia, unspecified: Secondary | ICD-10-CM | POA: Diagnosis not present

## 2023-07-20 DIAGNOSIS — Z6830 Body mass index (BMI) 30.0-30.9, adult: Secondary | ICD-10-CM | POA: Diagnosis not present

## 2023-07-20 DIAGNOSIS — D539 Nutritional anemia, unspecified: Secondary | ICD-10-CM | POA: Diagnosis not present

## 2023-08-13 DIAGNOSIS — Z419 Encounter for procedure for purposes other than remedying health state, unspecified: Secondary | ICD-10-CM | POA: Diagnosis not present

## 2023-09-12 DIAGNOSIS — Z419 Encounter for procedure for purposes other than remedying health state, unspecified: Secondary | ICD-10-CM | POA: Diagnosis not present
